# Patient Record
Sex: Female | Born: 1955 | Race: Asian | Hispanic: No | Marital: Married | Smoking: Former smoker
Health system: Southern US, Community
[De-identification: ages and names within clinical notes are randomized; demographics above are authoritative.]

## PROBLEM LIST (undated history)

## (undated) DIAGNOSIS — M1990 Unspecified osteoarthritis, unspecified site: Secondary | ICD-10-CM

## (undated) DIAGNOSIS — I10 Essential (primary) hypertension: Secondary | ICD-10-CM

## (undated) DIAGNOSIS — E119 Type 2 diabetes mellitus without complications: Secondary | ICD-10-CM

## (undated) DIAGNOSIS — R030 Elevated blood-pressure reading, without diagnosis of hypertension: Secondary | ICD-10-CM

## (undated) HISTORY — DX: Unspecified osteoarthritis, unspecified site: M19.90

## (undated) HISTORY — DX: Elevated blood-pressure reading, without diagnosis of hypertension: R03.0

---

## 2000-09-08 ENCOUNTER — Other Ambulatory Visit: Admission: RE | Admit: 2000-09-08 | Discharge: 2000-09-08 | Payer: Self-pay | Admitting: General Practice

## 2002-05-04 ENCOUNTER — Inpatient Hospital Stay (HOSPITAL_COMMUNITY): Admission: AD | Admit: 2002-05-04 | Discharge: 2002-05-04 | Payer: Self-pay | Admitting: *Deleted

## 2002-06-04 ENCOUNTER — Ambulatory Visit (HOSPITAL_COMMUNITY): Admission: RE | Admit: 2002-06-04 | Discharge: 2002-06-04 | Payer: Self-pay | Admitting: *Deleted

## 2002-06-10 ENCOUNTER — Encounter: Admission: RE | Admit: 2002-06-10 | Discharge: 2002-06-10 | Payer: Self-pay | Admitting: *Deleted

## 2002-08-11 ENCOUNTER — Ambulatory Visit (HOSPITAL_COMMUNITY): Admission: RE | Admit: 2002-08-11 | Discharge: 2002-08-11 | Payer: Self-pay | Admitting: *Deleted

## 2002-08-11 ENCOUNTER — Encounter: Admission: RE | Admit: 2002-08-11 | Discharge: 2002-08-11 | Payer: Self-pay | Admitting: *Deleted

## 2002-10-28 ENCOUNTER — Encounter (INDEPENDENT_AMBULATORY_CARE_PROVIDER_SITE_OTHER): Payer: Self-pay

## 2002-10-28 ENCOUNTER — Inpatient Hospital Stay (HOSPITAL_COMMUNITY): Admission: AD | Admit: 2002-10-28 | Discharge: 2002-10-31 | Payer: Self-pay | Admitting: Obstetrics & Gynecology

## 2005-04-18 ENCOUNTER — Emergency Department (HOSPITAL_COMMUNITY): Admission: EM | Admit: 2005-04-18 | Discharge: 2005-04-18 | Payer: Self-pay | Admitting: Emergency Medicine

## 2006-02-18 ENCOUNTER — Emergency Department (HOSPITAL_COMMUNITY): Admission: EM | Admit: 2006-02-18 | Discharge: 2006-02-18 | Payer: Self-pay | Admitting: Emergency Medicine

## 2007-02-14 ENCOUNTER — Emergency Department (HOSPITAL_COMMUNITY): Admission: EM | Admit: 2007-02-14 | Discharge: 2007-02-15 | Payer: Self-pay | Admitting: Emergency Medicine

## 2009-08-06 ENCOUNTER — Emergency Department (HOSPITAL_COMMUNITY)
Admission: EM | Admit: 2009-08-06 | Discharge: 2009-08-06 | Payer: Self-pay | Source: Home / Self Care | Admitting: Emergency Medicine

## 2009-08-29 ENCOUNTER — Emergency Department (HOSPITAL_COMMUNITY): Admission: EM | Admit: 2009-08-29 | Discharge: 2009-08-29 | Payer: Self-pay | Admitting: Emergency Medicine

## 2010-06-29 NOTE — Op Note (Signed)
NAMESAIDI, Dawn [name]                               ACCOUNT NO.:  [id number]   MEDICAL RECORD NO.:  [id number]                   PATIENT TYPE:  INP   LOCATION:  9124                                 FACILITY:  [facility]   PHYSICIAN:  Lesly Dukes, M.D.              DATE OF BIRTH:  December 04, 1955   DATE OF PROCEDURE:  10/28/2002  DATE OF DISCHARGE:                                 OPERATIVE REPORT   PREOPERATIVE DIAGNOSES:  1. Frank breech in labor.  2. Multiparity, with desire for permanent sterilization.   POSTOPERATIVE DIAGNOSES:  1. Frank breech in labor.  2. Multiparity, with desire for permanent sterilization.   FINDINGS:  A viable female infant, Apgars 8 at one, 9 at five, found in  frank breech presentation __________ , with normal tubes and ovaries  bilaterally.   PROCEDURE:  After informed consent was obtained, the patient was taken to  the operating room, where spinal anesthesia was found to be adequate.  The  patient was placed in the dorsal supine position with a left lateral tilt.  The abdomen was prepped and draped in a sterile fashion.  An IV was running.  A Pfannenstiel skin incision was made with a scalpel and carried down  through skin and subcutaneous tissue to the rectus fascia.  The fascia was  incised in the midline and the incision extended bilaterally with Mayo  scissors.  The superior and inferior aspect of the fascia were grasped  __________ layer separated in the midline.  The peritoneum was divided  sharply in a transverse fashion __________  of the bladder.  The bladder  blade was inserted.  The vesicouterine peritoneum was identified and tented  up and entered sharply with Metzenbaum scissors.  The incision was extended  bilaterally and the bladder flap was elevated distally.  The bladder blade  was reinserted.  The uterine incision was made with a scalpel and extended  bilaterally with bandage scissors.  Baby delivered by frank breech without  incident.  The  nose and mouth were suctioned with DeLee and bulb suction.  The cord was clamped and cut, and baby handed off to the awaiting  pediatrician.  The placenta delivered manually with a three-vessel cord.  The uterus was exteriorized and freed of all clots and debris.  The incision  was closed with 0 Vicryl in a running locked fashion.  Good hemostasis was  noted.  The tubal was then completed.  A [name] was used to elevate the  midportion of the tube, which was then ligated with 2-0 plain with 2  stitches.  The portion of the tube was then cut and sent to pathology.  This  was done on both the right and left side.  Both sides were noted to be  hemostatic.  The gutters were cleared of all clot and debris, and the uterus  was returned to the abdomen.  Good hemostasis was noted on the uterine  incision.  The fascia was closed with 0 Vicryl and good hemostasis was  noted.  The subcutaneous tissue was irrigated.  The patient tolerated the  procedure well. Sponge, lap, instrument, and needle counts correct x2, and  patient went to the recovery room in stable condition.                                               Lesly Dukes, M.D.    [name]  D:  10/28/2002  T:  10/29/2002  Job:  [id number]

## 2010-06-29 NOTE — Discharge Summary (Signed)
[name], [name]                               ACCOUNT NO.:  [id number]   MEDICAL RECORD NO.:  [id number]                   PATIENT TYPE:  INP   LOCATION:  9124                                 FACILITY:  [facility]   PHYSICIAN:  Lesly Dukes, M.D.              DATE OF BIRTH:  1955/05/28   DATE OF ADMISSION:  10/28/2002  DATE OF DISCHARGE:  10/31/2002                                 DISCHARGE SUMMARY   ADMISSION DIAGNOSES:  Multipara at term in active labor with frank breech  presentation.   DISCHARGE DIAGNOSES:  Primary low transverse cesarean section and bilateral  tubal ligation.  Viable female infant Apgars 8/1, 9/5.  Weight 6 pounds 8  ounces.  Thick meconium.   HISTORY:  A 55 year old G3, P2-0-0-2 at 37-5/7 weeks via sure LMP consistent  with 16-week scan presented with history of contractions for about two hours  and rupture of membranes in the maternity admissions area, clear fluid.  She  denied vaginal bleeding and reported that her fetal activity was decreased.  Her pregnancy care was at [facility] with onset at 16 weeks, significant  for advanced maternal age, tobacco use, and yeast infection.   MEDICATIONS:  PND.   ALLERGIES:  None.   PAST OBSTETRICAL HISTORY:  She had term SVDs x2.   GYN HISTORY:  Negative for STIs or abnormal Pap smears.   PAST MEDICAL HISTORY:  Noncontributory.   PAST SURGICAL HISTORY:  None.   SOCIAL HISTORY:  Positive tobacco use.  No illicit drug use.   FAMILY HISTORY:  Noncontributory.   PRENATAL LABORATORIES:  Immunity to rubella and B+ blood type.  Her one-hour  glucose test was 106.   PHYSICAL EXAMINATION:  GENERAL:  Admission physical examination was within  normal limits.  PELVIC:  Was noted on visual cervical examination that she was frank breech  and 7-8 cm dilated.  Fetal heart rate was in the 140s, reactive with good  variability and no decelerations.  Her contraction frequency was every 1-3  minutes.   HOSPITAL COURSE:   The patient was taken from maternity admissions to the  operating room for an urgent low transverse cesarean section under spinal  anesthesia.  Findings were as above.  Her postoperative course was  unremarkable.  She was breast-feeding.  Her pain was well controlled.  Her  discharge hemoglobin 11.8, hematocrit 33.1, platelets borderline low at  120,000.  On admission they were 156,000.  She remained afebrile with some  high normal blood pressures.  On postoperative day three she was ambulatory  with orthostatic symptoms.  Pain was tolerable.  Her lochia was tapering.  She was breast-feeding well.  Her blood pressure was 120/80.  She was  afebrile.  Breasts were soft and filling.  Abdomen was soft, nontender,  nondistended.  Incision was clean, dry, intact, and appropriately tender.  Lungs were clear.  Heart regular  rate and rhythm.  Extremities were  nontender.  Staples were removed and she was discharged home to have follow-  up at [facility] at 6 weeks.  As noted above, she had a bilateral tubal  ligation due to multiparity and desires of no further fertility.   DISCHARGE MEDICATIONS:  1. Prenatal vitamins one daily.  2. Ibuprofen 600 mg one q.6h. p.r.n.  3. Percocet 5/325 one q.4-6h. p.r.n. for pain.   CONDITION ON DISCHARGE:  She was discharged home in good condition.     Deirdre Christy Gentles, C.N.M.                       Lesly Dukes, M.D.    DP/MEDQ  D:  12/13/2002  T:  12/14/2002  Job:  [id number]

## 2010-11-01 LAB — URINALYSIS, ROUTINE W REFLEX MICROSCOPIC
Glucose, UA: NEGATIVE
Hgb urine dipstick: NEGATIVE
Ketones, ur: NEGATIVE
Protein, ur: NEGATIVE
Urobilinogen, UA: 1
pH: 6.5

## 2010-11-01 LAB — COMPREHENSIVE METABOLIC PANEL
ALT: 9
AST: 18
CO2: 28
Calcium: 9
Chloride: 105
Glucose, Bld: 105 — ABNORMAL HIGH
Potassium: 3.3 — ABNORMAL LOW
Sodium: 139
Total Bilirubin: 0.6

## 2010-11-01 LAB — CBC
Hemoglobin: 13.4
MCHC: 34.4
MCV: 81.3
Platelets: 196
RBC: 4.79
WBC: 8.9

## 2010-11-01 LAB — DIFFERENTIAL
Eosinophils Relative: 1
Lymphs Abs: 3.2
Monocytes Absolute: 0.4
Monocytes Relative: 5

## 2013-04-17 ENCOUNTER — Emergency Department (INDEPENDENT_AMBULATORY_CARE_PROVIDER_SITE_OTHER)
Admission: EM | Admit: 2013-04-17 | Discharge: 2013-04-17 | Disposition: A | Discharge status: Home / Self Care | Payer: Self-pay | Source: Home / Self Care | Attending: Family Medicine | Admitting: Family Medicine

## 2013-04-17 ENCOUNTER — Encounter (HOSPITAL_COMMUNITY): Payer: Self-pay | Admitting: Emergency Medicine

## 2013-04-17 ENCOUNTER — Emergency Department (INDEPENDENT_AMBULATORY_CARE_PROVIDER_SITE_OTHER): Payer: Self-pay

## 2013-04-17 DIAGNOSIS — S63617A Unspecified sprain of left little finger, initial encounter: Secondary | ICD-10-CM

## 2013-04-17 DIAGNOSIS — S6390XA Sprain of unspecified part of unspecified wrist and hand, initial encounter: Secondary | ICD-10-CM

## 2013-04-17 NOTE — ED Provider Notes (Signed)
CSN: [id number]     Arrival date & time 04/17/13  0932 History   First MD Initiated Contact with Patient 04/17/13 1009     Chief Complaint  Patient presents with  . Fall   (Consider location/radiation/quality/duration/timing/severity/associated sxs/prior Treatment) Patient is a 58 y.o. female presenting with hand injury. The history is provided by the patient.  Hand Injury Location:  Hand Time since incident:  1 week Injury: yes   Mechanism of injury comment:  Slipped and fell in snow  Hand location:  L hand Pain details:    Radiates to:  Does not radiate   Severity:  Mild   Onset quality:  Sudden   Progression:  Worsening Chronicity:  New Dislocation: no   Associated symptoms: back pain, neck pain and swelling     History reviewed. No pertinent past medical history. History reviewed. No pertinent past surgical history. History reviewed. No pertinent family history. History  Substance Use Topics  . Smoking status: Current Every Day Smoker  . Smokeless tobacco: Not on file  . Alcohol Use: No   OB History   Grav Para Term Preterm Abortions TAB SAB Ect Mult Living                 Review of Systems  Constitutional: Negative.   Cardiovascular: Negative for chest pain.  Gastrointestinal: Negative for abdominal pain.  Musculoskeletal: Positive for back pain, joint swelling and neck pain.  Neurological: Negative for headaches.    Allergies  Review of patient's allergies indicates no known allergies.  Home Medications  No current outpatient prescriptions on file. BP 123/76  Pulse 68  Temp(Src) 98.2 F (36.8 C) (Oral)  Resp 18  SpO2 97% Physical Exam  Nursing note and vitals reviewed. Constitutional: She is oriented to person, place, and time. She appears well-developed and well-nourished.  HENT:  Head: Normocephalic and atraumatic.  Eyes: EOM are normal. Pupils are equal, round, and reactive to light.  Neck: Normal range of motion. Neck supple.  Musculoskeletal:  She exhibits tenderness.       Left hand: She exhibits decreased range of motion, tenderness and bony tenderness. Normal sensation noted. Normal strength noted.       Hands: Neurological: She is alert and oriented to person, place, and time.  Skin: Skin is warm and dry.    ED Course  Procedures (including critical care time) Labs Review Labs Reviewed - No data to display Imaging Review Dg Hand Complete Left  04/17/2013   CLINICAL DATA:  Fall.  EXAM: LEFT HAND - COMPLETE 3+ VIEW  COMPARISON:  None.  FINDINGS: There is no evidence of fracture or dislocation. There is no evidence of arthropathy or other focal bone abnormality. Soft tissues are unremarkable.  IMPRESSION: Negative.   Electronically Signed   By: Kerby Moors M.D.   On: 04/17/2013 10:52   X-rays reviewed and report per radiologist.   MDM   1. Sprain of hand, fifth finger, left        Billy Fischer, MD 04/17/13 1103
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
## 2013-04-17 NOTE — Discharge Instructions (Signed)
Soak hand in warm water every day for 15 minutes. Wear splint for comfort as needed.ibuprofen for pain.

## 2016-07-17 ENCOUNTER — Ambulatory Visit (HOSPITAL_COMMUNITY)
Admission: EM | Admit: 2016-07-17 | Discharge: 2016-07-17 | Disposition: A | Discharge status: Nursing Home (Includes ICF) | Payer: BLUE CROSS/BLUE SHIELD | Attending: Internal Medicine | Admitting: Internal Medicine

## 2016-07-17 ENCOUNTER — Emergency Department (HOSPITAL_COMMUNITY)
Admission: EM | Admit: 2016-07-17 | Discharge: 2016-07-17 | Disposition: A | Discharge status: Home / Self Care | Payer: BLUE CROSS/BLUE SHIELD | Attending: Emergency Medicine | Admitting: Emergency Medicine

## 2016-07-17 ENCOUNTER — Encounter (HOSPITAL_COMMUNITY): Payer: Self-pay | Admitting: Emergency Medicine

## 2016-07-17 ENCOUNTER — Ambulatory Visit (INDEPENDENT_AMBULATORY_CARE_PROVIDER_SITE_OTHER): Payer: BLUE CROSS/BLUE SHIELD

## 2016-07-17 DIAGNOSIS — I509 Heart failure, unspecified: Secondary | ICD-10-CM

## 2016-07-17 DIAGNOSIS — R0600 Dyspnea, unspecified: Secondary | ICD-10-CM

## 2016-07-17 DIAGNOSIS — F17200 Nicotine dependence, unspecified, uncomplicated: Secondary | ICD-10-CM | POA: Insufficient documentation

## 2016-07-17 DIAGNOSIS — R0789 Other chest pain: Secondary | ICD-10-CM | POA: Insufficient documentation

## 2016-07-17 LAB — BASIC METABOLIC PANEL
Anion gap: 8 (ref 5–15)
BUN: 16 mg/dL (ref 6–20)
CALCIUM: 8.9 mg/dL (ref 8.9–10.3)
CO2: 24 mmol/L (ref 22–32)
CREATININE: 0.89 mg/dL (ref 0.44–1.00)
Chloride: 107 mmol/L (ref 101–111)
GFR calc non Af Amer: >60 mL/min (ref >60)
Glucose, Bld: 108 mg/dL — ABNORMAL HIGH (ref 65–99)
Potassium: 4 mmol/L (ref 3.5–5.1)
SODIUM: 139 mmol/L (ref 135–145)

## 2016-07-17 LAB — CBC
HCT: 39.9 % (ref 36.0–46.0)
Hemoglobin: 13.2 g/dL (ref 12.0–15.0)
MCH: 27.2 pg (ref 26.0–34.0)
MCHC: 33.1 g/dL (ref 30.0–36.0)
MCV: 82.3 fL (ref 78.0–100.0)
PLATELETS: 242 10*3/uL (ref 150–400)
RBC: 4.85 MIL/uL (ref 3.87–5.11)
RDW: 13.8 % (ref 11.5–15.5)
WBC: 10.8 10*3/uL — AB (ref 4.0–10.5)

## 2016-07-17 LAB — BRAIN NATRIURETIC PEPTIDE: B NATRIURETIC PEPTIDE 5: 42.5 pg/mL (ref 0.0–100.0)

## 2016-07-17 LAB — I-STAT TROPONIN, ED
TROPONIN I, POC: 0 ng/mL (ref 0.00–0.08)
TROPONIN I, POC: 0 ng/mL (ref 0.00–0.08)

## 2016-07-17 MED ORDER — FUROSEMIDE 10 MG/ML IJ SOLN
20.0000 mg | Freq: Once | INTRAMUSCULAR | Status: AC
  Administered 2016-07-17: 20 mg via INTRAVENOUS
  Filled 2016-07-17: qty 2

## 2016-07-17 MED ORDER — FUROSEMIDE 20 MG PO TABS: 20.0000 mg | ORAL_TABLET | Freq: Every day | ORAL | 0 refills | Status: DC

## 2016-07-17 NOTE — ED Triage Notes (Signed)
[facility] and [facility], they told her she had heart failure. C/o sob x2 years. Coughing. Pt in NAD.

## 2016-07-17 NOTE — Discharge Instructions (Signed)
Your Chest x-ray suggests findings of heart failure. Please take water pill to help with your symptoms.  You will need to see a heart doctor as soon as possible for work-up.  Please call the cardiology office tomorrow to set up close follow-up appointment. Please return without fail for worsening symptoms, including shortness of breath, passing out, worsening pain or any other symptoms concerning to you.

## 2016-07-17 NOTE — ED Provider Notes (Signed)
CSN: [id number]     Arrival date & time 07/17/16  1032 History   First MD Initiated Contact with Patient 07/17/16 1246     Chief Complaint  Patient presents with  . URI   (Consider location/radiation/quality/duration/timing/severity/associated sxs/prior Treatment) 61 year old female presents with shortness of breath and random body aches for the past 2 years. However yesterday her breathing got worse and she is having more right sided chest pain. Also experiencing a mild dry cough. Denies any fever, ear pain or other URI symptoms. No history of asthma or allergies. Does smoke tobacco daily. No history of cardiac issues. No other chronic health issues. Takes no daily medication.    The history is provided by the patient and a relative. The history is limited by a language barrier.    History reviewed. No pertinent past medical history. History reviewed. No pertinent surgical history. No family history on file. Social History  Substance Use Topics  . Smoking status: Current Every Day Smoker  . Smokeless tobacco: Not on file  . Alcohol use No   OB History    No data available     Review of Systems  Constitutional: Positive for fatigue. Negative for activity change, appetite change, chills and fever.  HENT: Negative for congestion, ear pain, facial swelling, postnasal drip, rhinorrhea, sinus pain, sinus pressure, sore throat and trouble swallowing.   Eyes: Negative for photophobia and visual disturbance.  Respiratory: Positive for cough and shortness of breath. Negative for chest tightness and wheezing.   Cardiovascular: Positive for chest pain (right sided). Negative for palpitations.  Gastrointestinal: Negative for diarrhea, nausea and vomiting.  Genitourinary: Negative for difficulty urinating.  Musculoskeletal: Positive for arthralgias and myalgias. Negative for neck pain and neck stiffness.  Skin: Negative for rash and wound.  Neurological: Positive for numbness (random  extremity). Negative for dizziness, syncope, facial asymmetry, weakness, light-headedness and headaches.  Hematological: Negative for adenopathy.    Allergies  Patient has no known allergies.  Home Medications   Prior to Admission medications   Medication Sig Start Date End Date Taking? Authorizing Provider  Aspirin-Salicylamide-Caffeine (BC HEADACHE POWDER PO) Take by mouth.   Yes [provider]   Meds Ordered and Administered this Visit  Medications - No data to display  BP 134/73 (BP Location: Left Arm)   Pulse 69   Temp 98.7 F (37.1 C) (Oral)   Resp 18   SpO2 97%  No data found.   Physical Exam  Constitutional: She appears well-developed and well-nourished. No distress.  Patient resting comfortably on exam table. In no acute distress.   HENT:  Head: Normocephalic and atraumatic.  Right Ear: Hearing and external ear normal.  Left Ear: Hearing and external ear normal.  Nose: Nose normal.  Mouth/Throat: Uvula is midline, oropharynx is clear and moist and mucous membranes are normal.  Neck: Normal range of motion. Neck supple.  Cardiovascular: Normal rate, regular rhythm and normal heart sounds.   Pulmonary/Chest: Effort normal and breath sounds normal. No respiratory distress. She has no decreased breath sounds. She has no wheezes. She has no rhonchi. She has no rales. She exhibits tenderness.    Tender along right upper chest area above breast. No redness, swelling or bruising present.   Musculoskeletal: Normal range of motion.  Lymphadenopathy:    She has no cervical adenopathy.  Neurological: She is alert.  Skin: Skin is warm and dry.  Psychiatric: She has a normal mood and affect.    Urgent Care Course  Procedures (including critical care time)  Labs Review Labs Reviewed - No data to display  Imaging Review Dg Chest 2 View  Result Date: 07/17/2016 CLINICAL DATA:  Chest pain. EXAM: CHEST  2 VIEW COMPARISON:  08/29/2009. FINDINGS: Mediastinum  hilar structures normal. Cardiomegaly with mild bilateral interstitial prominence. Findings consistent with mild CHF. Other etiologies of interstitial prominence including pneumonitis cannot be excluded . No pleural effusion or pneumothorax. IMPRESSION: Congestive heart failure mild interstitial edema. Other etiologies of interstitial lung disease including pneumonitis cannot be excluded. Findings are new from 08/29/2009. Electronically Signed   By: Marcello Moores  Register   On: 07/17/2016 13:26     Visual Acuity Review  Right Eye Distance:   Left Eye Distance:   Bilateral Distance:    Right Eye Near:   Left Eye Near:    Bilateral Near:         MDM   1. Other congestive heart failure ([place])   2. Dyspnea, unspecified type    Reviewed chest x-ray results with patient and relative. Due to findings of mild CHF and having acute symptoms, recommend patient go to ER for further evaluation and treatment. Patient vital signs are stable and she is in no acute distress so she is sent by shuttle to the ER. Patient and Relative understands and will go now for further evaluation.      Katy Apo, NP 07/17/16 1416

## 2016-07-17 NOTE — ED Triage Notes (Addendum)
Complains of body aches and difficulty breathing for 2 years, patient is a smoker.  Denies cold symptoms.  Patient has had more difficulty breathing since yesterday.  Patient has a dry cough.  There is no new pain.  All pain has been present for 2 years

## 2016-07-17 NOTE — ED Notes (Signed)
Pt ambulated with stand by assist without difficulty; normal gait noted. Pt denied any dizziness, weakness, lightheadedness during such.   Lowest O2 sat: 94% Highest O2 sat: 97%  HR range 84-96bpm

## 2016-07-17 NOTE — Discharge Instructions (Signed)
Due to the findings on the chest x-ray which indicated heart failure, you need to go to the ER now for further evaluation.

## 2016-07-17 NOTE — ED Provider Notes (Signed)
[facility] DEPT Provider Note   CSN: [id number] Arrival date & time: 07/17/16  1533     History   Chief Complaint Chief Complaint  Patient presents with  . Chest Pain    HPI Dawn [name] is a 61 y.o. female.  HPI  61 year old female who presents with chest pain. She has no significant PMH. Reports 3 days of constant right anterior chest wall pain that is worse with movement and palpation. 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 Did not take any medications for symptoms. No alleviating factors. Over past day has felt short of breath with activity. No LE edema, orthopnea, PND, fever or chills, pleuritic chest pain, nausea, vomiting, diaphoresis. Has had mild cough, nonproductive of sputum. No known sick contacts. No known family history or cardiac or pulmonary medical problems.   Seen at [facility] today with CXR concerning for CHF and she was sent to ED for evaluation.  History reviewed. No pertinent past medical history.  There are no active problems to display for this patient.   History reviewed. No pertinent surgical history.  OB History    No data available       Home Medications    Prior to Admission medications   Medication Sig Start Date End Date Taking? Authorizing Provider  Aspirin-Salicylamide-Caffeine (BC HEADACHE POWDER PO) Take by mouth.    [provider]  furosemide (LASIX) 20 MG tablet Take 1 tablet (20 mg total) by mouth daily. 07/17/16   Forde Dandy, MD    Family History No family history on file.  Social History Social History  Substance Use Topics  . Smoking status: Current Every Day Smoker  . Smokeless tobacco: Not on file  . Alcohol use No     Allergies   Patient has no known allergies.   Review of Systems Review of Systems  Constitutional: Negative for fever.  HENT: Negative for congestion.   Respiratory: Positive for cough and shortness of breath.   Cardiovascular: Positive for  chest pain. Negative for leg swelling.  Gastrointestinal: Negative for abdominal pain.  Musculoskeletal: Negative for back pain.  Allergic/Immunologic: Negative for immunocompromised state.  Neurological: Negative for syncope and light-headedness.  Hematological: Does not bruise/bleed easily.  Psychiatric/Behavioral: Negative for confusion.  All other systems reviewed and are negative.    Physical Exam Updated Vital Signs BP (!) 115/50   Pulse 67   Temp 98.1 F (36.7 C) (Oral)   Resp (!) 22   SpO2 95%   Physical Exam Physical Exam  Nursing note and vitals reviewed. Constitutional: Well developed, well nourished, non-toxic, and in no acute distress Head: Normocephalic and atraumatic.  Mouth/Throat: Oropharynx is clear and moist.  Neck: Normal range of motion. Neck supple.  Cardiovascular: Normal rate and regular rhythm.  right anterior chest wall pain with palpation Pulmonary/Chest: Effort normal and breath sounds normal. no conversational dyspnea.  Abdominal: Soft. There is no tenderness. There is no rebound and no guarding.  Musculoskeletal: Normal range of motion. no edema. Neurological: Alert, no facial droop, fluent speech, moves all extremities symmetrically Skin: Skin is warm and dry.  Psychiatric: Cooperative   ED Treatments / Results  Labs (all labs ordered are listed, but only abnormal results are displayed) Labs Reviewed  BASIC METABOLIC PANEL - Abnormal; Notable for the following:       Result Value   Glucose, Bld 108 (*)    All other components within normal limits  CBC - Abnormal;  Notable for the following:    WBC 10.8 (*)    All other components within normal limits  BRAIN NATRIURETIC PEPTIDE  I-STAT TROPOININ, ED  I-STAT TROPOININ, ED    EKG  EKG Interpretation None       Radiology Dg Chest 2 View  Result Date: 07/17/2016 CLINICAL DATA:  Chest pain. EXAM: CHEST  2 VIEW COMPARISON:  08/29/2009. FINDINGS: Mediastinum hilar structures normal.  Cardiomegaly with mild bilateral interstitial prominence. Findings consistent with mild CHF. Other etiologies of interstitial prominence including pneumonitis cannot be excluded . No pleural effusion or pneumothorax. IMPRESSION: Congestive heart failure mild interstitial edema. Other etiologies of interstitial lung disease including pneumonitis cannot be excluded. Findings are new from 08/29/2009. Electronically Signed   By: Marcello Moores  Register   On: 07/17/2016 13:26    Procedures Procedures (including critical care time)  Medications Ordered in ED Medications  furosemide (LASIX) injection 20 mg (20 mg Intravenous Given 07/17/16 1904)     Initial Impression / Assessment and Plan / ED Course  I have reviewed the triage vital signs and the nursing notes.  Pertinent labs & imaging results that were available during my care of the patient were reviewed by me and considered in my medical decision making (see chart for details).     Presenting with CXR concerning for CHF. She does not look clinically fluid overloaded. Ambulates in ED with normal work of breathing and normal pulse ox. Chest pain is reproduced with movement and palpation, and seems c/w MSK pain and she has reasonable history for it. EKG is without heart strain or ischemia. Troponin is normal. BNP is normal. She is given 20 mg of IV lasix here. I do not feel it is unreasonable for her to obtain CHF work-up as outpatient given she is well appearing, not short of breath or hypoxic in ED. Started on 20 mg lasix orally for home and given cardiology follow-up. Discussed strict return instructions that would warrant admission. Strict return and follow-up instructions reviewed. She expressed understanding of all discharge instructions and felt comfortable with the plan of care.   Final Clinical Impressions(s) / ED Diagnoses   Final diagnoses:  Chest wall pain  Acute congestive heart failure, unspecified heart failure type [facility])    New  Prescriptions Discharge Medication List as of 07/17/2016 10:33 PM    START taking these medications   Details  furosemide (LASIX) 20 MG tablet Take 1 tablet (20 mg total) by mouth daily., Starting Wed 07/17/2016, Print         Forde Dandy, MD 07/18/16 [phone]

## 2016-07-22 DIAGNOSIS — R079 Chest pain, unspecified: Secondary | ICD-10-CM | POA: Insufficient documentation

## 2016-07-22 DIAGNOSIS — I509 Heart failure, unspecified: Secondary | ICD-10-CM | POA: Insufficient documentation

## 2016-08-02 ENCOUNTER — Ambulatory Visit: Payer: BLUE CROSS/BLUE SHIELD | Admitting: Cardiovascular Disease
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
## 2016-08-27 ENCOUNTER — Ambulatory Visit (INDEPENDENT_AMBULATORY_CARE_PROVIDER_SITE_OTHER): Payer: BLUE CROSS/BLUE SHIELD | Admitting: Cardiology

## 2016-08-27 ENCOUNTER — Encounter: Payer: Self-pay | Admitting: Cardiology

## 2016-08-27 VITALS — BP 152/76 | HR 68 | Ht 62.0 in | Wt 142.0 lb

## 2016-08-27 DIAGNOSIS — Z720 Tobacco use: Secondary | ICD-10-CM

## 2016-08-27 DIAGNOSIS — R030 Elevated blood-pressure reading, without diagnosis of hypertension: Secondary | ICD-10-CM | POA: Diagnosis not present

## 2016-08-27 DIAGNOSIS — I5031 Acute diastolic (congestive) heart failure: Secondary | ICD-10-CM | POA: Diagnosis not present

## 2016-08-27 DIAGNOSIS — R0789 Other chest pain: Secondary | ICD-10-CM | POA: Diagnosis not present

## 2016-08-27 NOTE — Patient Instructions (Signed)
Medication Instructions:   NO CHANGE  Testing/Procedures:  Your physician has requested that you have an echocardiogram. Echocardiography is a painless test that uses sound waves to create images of your heart. It provides your doctor with information about the size and shape of your heart and how well your heart's chambers and valves are working. This procedure takes approximately one hour. There are no restrictions for this procedure.    Follow-Up:  Your physician recommends that you schedule a follow-up appointment in: AS NEEDED      

## 2016-09-05 ENCOUNTER — Other Ambulatory Visit (HOSPITAL_COMMUNITY): Payer: BLUE CROSS/BLUE SHIELD

## 2016-09-09 ENCOUNTER — Other Ambulatory Visit (HOSPITAL_COMMUNITY): Payer: BLUE CROSS/BLUE SHIELD

## 2016-09-20 ENCOUNTER — Ambulatory Visit (HOSPITAL_COMMUNITY): Payer: BLUE CROSS/BLUE SHIELD | Attending: Cardiovascular Disease

## 2016-09-20 ENCOUNTER — Other Ambulatory Visit: Payer: Self-pay

## 2016-09-20 DIAGNOSIS — I5031 Acute diastolic (congestive) heart failure: Secondary | ICD-10-CM | POA: Diagnosis not present

## 2016-09-20 DIAGNOSIS — R0789 Other chest pain: Secondary | ICD-10-CM | POA: Diagnosis not present

## 2016-09-24 ENCOUNTER — Encounter: Payer: Self-pay | Admitting: *Deleted

## 2018-04-28 ENCOUNTER — Other Ambulatory Visit: Payer: Self-pay

## 2018-04-28 ENCOUNTER — Ambulatory Visit (HOSPITAL_COMMUNITY)
Admission: EM | Admit: 2018-04-28 | Discharge: 2018-04-28 | Disposition: A | Discharge status: Home / Self Care | Payer: BLUE CROSS/BLUE SHIELD | Attending: Physician Assistant | Admitting: Physician Assistant

## 2018-04-28 ENCOUNTER — Encounter (HOSPITAL_COMMUNITY): Payer: Self-pay | Admitting: Emergency Medicine

## 2018-04-28 DIAGNOSIS — G44209 Tension-type headache, unspecified, not intractable: Secondary | ICD-10-CM

## 2018-04-28 MED ORDER — IBUPROFEN 600 MG PO TABS: 600.0000 mg | ORAL_TABLET | Freq: Four times a day (QID) | ORAL | 0 refills | Status: DC | PRN

## 2018-04-28 MED ORDER — KETOROLAC TROMETHAMINE 60 MG/2ML IM SOLN
INTRAMUSCULAR | Status: AC
  Filled 2018-04-28: qty 2

## 2018-04-28 MED ORDER — KETOROLAC TROMETHAMINE 60 MG/2ML IM SOLN
60.0000 mg | Freq: Once | INTRAMUSCULAR | Status: AC
  Administered 2018-04-28: 60 mg via INTRAMUSCULAR

## 2018-04-28 NOTE — Discharge Instructions (Addendum)
See your Physician for recheck.  Stop BC powders

## 2018-04-28 NOTE — ED Provider Notes (Signed)
[facility]    CSN: [id number] Arrival date & time: 04/28/18  [place]     History   Chief Complaint Chief Complaint  Patient presents with  . Headache    HPI Dawn [name] is a 63 y.o. female.   The history is provided by the patient. No language interpreter was used.  Headache  Pain location:  Generalized Quality:  Unable to specify Radiates to:  Does not radiate Severity currently:  5/10 Onset quality:  Gradual Timing:  Constant Progression:  Worsening Chronicity:  New Relieved by:  Nothing Worsened by:  Nothing Ineffective treatments:  None tried Associated symptoms: ear pain   Risk factors: no anger and does not have insomnia   Pt reports she has a buzzing in her ears and a headache.  Pt reports she has been taking BC powders   Past Medical History:  Diagnosis Date  . Elevated BP without diagnosis of hypertension     Patient Active Problem List   Diagnosis Date Noted  . CHF (congestive heart failure) ([place]) 07/22/2016  . Chest pain 07/22/2016    Past Surgical History:  Procedure Laterality Date  . CESAREAN SECTION      OB History   No obstetric history on file.      Home Medications    Prior to Admission medications   Medication Sig Start Date End Date Taking? Authorizing Provider  Aspirin-Salicylamide-Caffeine (BC HEADACHE POWDER PO) Take by mouth.    [provider]  furosemide (LASIX) 20 MG tablet Take 1 tablet (20 mg total) by mouth daily. 07/17/16   Forde Dandy, MD  ibuprofen (ADVIL,MOTRIN) 600 MG tablet Take 1 tablet (600 mg total) by mouth every 6 (six) hours as needed. 04/28/18   Fransico Meadow, PA-C    Family History Family History  Problem Relation Age of Onset  . Chorea Father     Social History Social History   Tobacco Use  . Smoking status: Current Every Day Smoker  . Smokeless tobacco: Never Used  Substance Use Topics  . Alcohol use: Yes  . Drug use: Not on file     Allergies   Patient has no known  allergies.   Review of Systems Review of Systems  HENT: Positive for ear pain.   Neurological: Positive for headaches.  All other systems reviewed and are negative.    Physical Exam Triage Vital Signs ED Triage Vitals  Enc Vitals Group     BP 04/28/18 1800 (!) 168/70     Pulse Rate 04/28/18 1800 67     Resp 04/28/18 1800 18     Temp 04/28/18 1800 98.5 F (36.9 C)     Temp Source 04/28/18 1800 Oral     SpO2 04/28/18 1800 97 %     Weight --      Height --      Head Circumference --      Peak Flow --      Pain Score 04/28/18 1758 4     Pain Loc --      Pain Edu? --      Excl. in [place]? --    No data found.  Updated Vital Signs BP (!) 168/70 (BP Location: Left Arm)   Pulse 67   Temp 98.5 F (36.9 C) (Oral)   Resp 18   SpO2 97%   Visual Acuity Right Eye Distance:   Left Eye Distance:   Bilateral Distance:    Right Eye Near:   Left Eye Near:  Bilateral Near:     Physical Exam Vitals signs and nursing note reviewed.  Constitutional:      Appearance: She is well-developed.  HENT:     Head: Normocephalic.     Mouth/Throat:     Mouth: Mucous membranes are moist.  Eyes:     Extraocular Movements: Extraocular movements intact.  Neck:     Musculoskeletal: Normal range of motion.  Cardiovascular:     Rate and Rhythm: Normal rate.     Heart sounds: Normal heart sounds.  Pulmonary:     Effort: Pulmonary effort is normal.  Abdominal:     General: There is no distension.  Musculoskeletal: Normal range of motion.  Skin:    General: Skin is warm.  Neurological:     Mental Status: She is alert and oriented to person, place, and time.  Psychiatric:        Mood and Affect: Mood normal.      UC Treatments / Results  Labs (all labs ordered are listed, but only abnormal results are displayed) Labs Reviewed - No data to display  EKG None  Radiology No results found.  Procedures Procedures (including critical care time)  Medications Ordered in UC  Medications  ketorolac (TORADOL) injection 60 mg (60 mg Intramuscular Given 04/28/18 1905)    Initial Impression / Assessment and Plan / UC Course  I have reviewed the triage vital signs and the nursing notes.  Pertinent labs & imaging results that were available during my care of the patient were reviewed by me and considered in my medical decision making (see chart for details).     Pt given torodol IM.   Pt feels better.   I suspect ear buzzing may be second to asa in goody powder.  Pt has normal neuro exam  Pt advised to go to Ed if symptoms worsen or change.  Final Clinical Impressions(s) / UC Diagnoses   Final diagnoses:  Tension-type headache, not intractable, unspecified chronicity pattern     Discharge Instructions     See your Physician for recheck.  Stop BC powders    ED Prescriptions    Medication Sig Dispense Auth. Provider   ibuprofen (ADVIL,MOTRIN) 600 MG tablet Take 1 tablet (600 mg total) by mouth every 6 (six) hours as needed. 20 tablet Fransico Meadow, [place]     Controlled Substance Prescriptions [place] Controlled Substance Registry consulted? Not Applicable  An After Visit Summary was printed and given to the patient.   Fransico Meadow, [place] 04/28/18 2026

## 2018-04-28 NOTE — ED Triage Notes (Addendum)
Headache started yesterday.  Complains of legs pain, denies cough and cold symptoms.  Patient gets dizzy

## 2019-05-19 ENCOUNTER — Encounter (HOSPITAL_COMMUNITY): Payer: Self-pay | Admitting: Pediatrics

## 2019-05-19 ENCOUNTER — Emergency Department (HOSPITAL_COMMUNITY)
Admission: EM | Admit: 2019-05-19 | Discharge: 2019-05-20 | Disposition: A | Discharge status: Home / Self Care | Payer: Self-pay | Attending: Emergency Medicine | Admitting: Emergency Medicine

## 2019-05-19 ENCOUNTER — Emergency Department (HOSPITAL_COMMUNITY): Payer: Self-pay

## 2019-05-19 ENCOUNTER — Other Ambulatory Visit: Payer: Self-pay

## 2019-05-19 DIAGNOSIS — I509 Heart failure, unspecified: Secondary | ICD-10-CM | POA: Insufficient documentation

## 2019-05-19 DIAGNOSIS — F17210 Nicotine dependence, cigarettes, uncomplicated: Secondary | ICD-10-CM | POA: Insufficient documentation

## 2019-05-19 DIAGNOSIS — M791 Myalgia, unspecified site: Secondary | ICD-10-CM

## 2019-05-19 DIAGNOSIS — M7918 Myalgia, other site: Secondary | ICD-10-CM | POA: Insufficient documentation

## 2019-05-19 DIAGNOSIS — I110 Hypertensive heart disease with heart failure: Secondary | ICD-10-CM | POA: Insufficient documentation

## 2019-05-19 DIAGNOSIS — I159 Secondary hypertension, unspecified: Secondary | ICD-10-CM

## 2019-05-19 DIAGNOSIS — E876 Hypokalemia: Secondary | ICD-10-CM | POA: Insufficient documentation

## 2019-05-19 HISTORY — DX: Essential (primary) hypertension: I10

## 2019-05-19 LAB — COMPREHENSIVE METABOLIC PANEL
ALT: 10 U/L (ref 0–44)
AST: 22 U/L (ref 15–41)
Albumin: 3.9 g/dL (ref 3.5–5.0)
Alkaline Phosphatase: 81 U/L (ref 38–126)
Anion gap: 12 (ref 5–15)
BUN: 7 mg/dL — ABNORMAL LOW (ref 8–23)
CO2: 25 mmol/L (ref 22–32)
Calcium: 9.3 mg/dL (ref 8.9–10.3)
Chloride: 103 mmol/L (ref 98–111)
Creatinine, Ser: 0.95 mg/dL (ref 0.44–1.00)
GFR calc Af Amer: >60 mL/min (ref >60)
GFR calc non Af Amer: >60 mL/min (ref >60)
Glucose, Bld: 120 mg/dL — ABNORMAL HIGH (ref 70–99)
Potassium: 2.8 mmol/L — ABNORMAL LOW (ref 3.5–5.1)
Sodium: 140 mmol/L (ref 135–145)
Total Bilirubin: 0.5 mg/dL (ref 0.3–1.2)
Total Protein: 7.6 g/dL (ref 6.5–8.1)

## 2019-05-19 LAB — CBC WITH DIFFERENTIAL/PLATELET
Abs Immature Granulocytes: 0.03 10*3/uL (ref 0.00–0.07)
Basophils Absolute: 0.1 10*3/uL (ref 0.0–0.1)
Basophils Relative: 1 %
Eosinophils Absolute: 0.2 10*3/uL (ref 0.0–0.5)
Eosinophils Relative: 2 %
HCT: 42.4 % (ref 36.0–46.0)
Hemoglobin: 14.1 g/dL (ref 12.0–15.0)
Immature Granulocytes: 0 %
Lymphocytes Relative: 37 %
Lymphs Abs: 3.2 10*3/uL (ref 0.7–4.0)
MCH: 27.9 pg (ref 26.0–34.0)
MCHC: 33.3 g/dL (ref 30.0–36.0)
MCV: 84 fL (ref 80.0–100.0)
Monocytes Absolute: 0.4 10*3/uL (ref 0.1–1.0)
Monocytes Relative: 5 %
Neutro Abs: 4.9 10*3/uL (ref 1.7–7.7)
Neutrophils Relative %: 55 %
Platelets: 232 10*3/uL (ref 150–400)
RBC: 5.05 MIL/uL (ref 3.87–5.11)
RDW: 12.3 % (ref 11.5–15.5)
WBC: 8.8 10*3/uL (ref 4.0–10.5)
nRBC: 0 % (ref 0.0–0.2)

## 2019-05-19 NOTE — ED Triage Notes (Signed)
C/O dizziness, generalized aches and pain last week. Stated hx of high blood pressure,

## 2019-05-20 MED ORDER — NAPROXEN 250 MG PO TABS
375.0000 mg | ORAL_TABLET | Freq: Once | ORAL | Status: AC
Start: 2019-05-20 — End: 2019-05-20
  Administered 2019-05-20: 02:00:00 375 mg via ORAL
  Filled 2019-05-20: qty 2

## 2019-05-20 MED ORDER — POTASSIUM CHLORIDE CRYS ER 20 MEQ PO TBCR: 20.0000 meq | EXTENDED_RELEASE_TABLET | Freq: Every day | ORAL | 0 refills | Status: DC

## 2019-05-20 MED ORDER — POTASSIUM CHLORIDE CRYS ER 20 MEQ PO TBCR
40.0000 meq | EXTENDED_RELEASE_TABLET | Freq: Once | ORAL | Status: AC
  Administered 2019-05-20: 40 meq via ORAL
  Filled 2019-05-20: qty 2

## 2019-05-20 NOTE — ED Provider Notes (Signed)
[facility] EMERGENCY DEPARTMENT Provider Note  CSN: [id number] Arrival date & time: 05/19/19 1601  Chief Complaint(s) Dizziness  HPI Dawn Stoy is a 64 y.o. female with a history of hypertension who presents to the emergency department for 1 week of muscle aches.  This began slowly and gradually got worse over the week.  She reports that she works [employer], [employer].  Pain is located throughout her body mostly in her back and legs.  She denies any falls or trauma.  No recent fevers or infections.  No coughing or congestion.  No nausea or vomiting.  No diarrhea.  No urinary symptoms.  She was brought in by her friend who is with her at home and checked her blood pressure and noted that it was elevated.  The history is limited by a language barrier. Language interpreter used: daughter assisted. no interpreter available for [place].    Past Medical History Past Medical History:  Diagnosis Date  . Elevated BP without diagnosis of hypertension   . Hypertension    Patient Active Problem List   Diagnosis Date Noted  . CHF (congestive heart failure) ([place]) 07/22/2016  . Chest pain 07/22/2016   Home Medication(s) Prior to Admission medications   Medication Sig Start Date End Date Taking? Authorizing Provider  Aspirin-Salicylamide-Caffeine (BC HEADACHE POWDER PO) Take by mouth.    [provider]  furosemide (LASIX) 20 MG tablet Take 1 tablet (20 mg total) by mouth daily. 07/17/16   Forde Dandy, MD  ibuprofen (ADVIL,MOTRIN) 600 MG tablet Take 1 tablet (600 mg total) by mouth every 6 (six) hours as needed. 04/28/18   Fransico Meadow, PA-C  potassium chloride SA (KLOR-CON) 20 MEQ tablet Take 1 tablet (20 mEq total) by mouth daily. 05/20/19   Fatima Blank, MD                                                                                                                                    Past Surgical History Past Surgical History:  Procedure Laterality Date  .  CESAREAN SECTION     Family History Family History  Problem Relation Age of Onset  . Chorea Father     Social History Social History   Tobacco Use  . Smoking status: Current Every Day Smoker  . Smokeless tobacco: Never Used  Substance Use Topics  . Alcohol use: Yes  . Drug use: Not on file   Allergies Patient has no known allergies.  Review of Systems Review of Systems All other systems are reviewed and are negative for acute change except as noted in the HPI  Physical Exam Vital Signs  I have reviewed the triage vital signs BP 133/78 (BP Location: Left Arm)   Pulse 82   Temp 98.3 F (36.8 C) (Oral)   Resp 18   Wt 63.5 kg   SpO2 99%   BMI 25.61 kg/m   Physical Exam  Vitals reviewed.  Constitutional:      General: She is not in acute distress.    Appearance: She is well-developed. She is not diaphoretic.  HENT:     Head: Normocephalic and atraumatic.     Nose: Nose normal.  Eyes:     General: No scleral icterus.       Right eye: No discharge.        Left eye: No discharge.     Conjunctiva/sclera: Conjunctivae normal.     Pupils: Pupils are equal, round, and reactive to light.  Cardiovascular:     Rate and Rhythm: Normal rate and regular rhythm.     Heart sounds: No murmur. No friction rub. No gallop.   Pulmonary:     Effort: Pulmonary effort is normal. No respiratory distress.     Breath sounds: Normal breath sounds. No stridor. No rales.  Abdominal:     General: There is no distension.     Palpations: Abdomen is soft.     Tenderness: There is no abdominal tenderness.  Musculoskeletal:     Cervical back: Normal range of motion and neck supple.     Thoracic back: Tenderness (mild) present. No bony tenderness.     Lumbar back: Tenderness (mild) present. No bony tenderness.       Back:     Right upper leg: Tenderness (mild) present.     Left upper leg: Tenderness (mild) present.  Skin:    General: Skin is warm and dry.     Findings: No erythema or  rash.  Neurological:     Mental Status: She is alert and oriented to person, place, and time.     ED Results and Treatments Labs (all labs ordered are listed, but only abnormal results are displayed) Labs Reviewed  COMPREHENSIVE METABOLIC PANEL - Abnormal; Notable for the following components:      Result Value   Potassium 2.8 (*)    Glucose, Bld 120 (*)    BUN 7 (*)    All other components within normal limits  CBC WITH DIFFERENTIAL/PLATELET                                                                                                                         EKG   EKG Interpretation  Date/Time:  Thursday May 20 2019 02:33:13 EDT Ventricular Rate:  67 PR Interval:    QRS Duration: 96 QT Interval:  430 QTC Calculation: 454 R Axis:   71 Text Interpretation: Sinus rhythm No acute changes Confirmed by Addison Lank [phone]) on 05/20/2019 2:58:41 AM      Radiology DG Chest 2 View  Result Date: 05/19/2019 CLINICAL DATA:  Dizziness, pain EXAM: CHEST - 2 VIEW COMPARISON:  07/17/2016 FINDINGS: Heart and mediastinal contours are within normal limits. No focal opacities or effusions. No acute bony abnormality. IMPRESSION: No active cardiopulmonary disease. Electronically Signed   By: Rolm Baptise M.D.   On: 05/19/2019 17:37    Pertinent labs & imaging  results that were available during my care of the patient were reviewed by me and considered in my medical decision making (see chart for details).  Medications Ordered in ED Medications  naproxen (NAPROSYN) tablet 375 mg (375 mg Oral Given 05/20/19 0218)  potassium chloride SA (KLOR-CON) CR tablet 40 mEq (40 mEq Oral Given 05/20/19 0228)                                                                                                                                    Procedures Procedures  (including critical care time)  Medical Decision Making / ED Course I have reviewed the nursing notes for this encounter and the patient's prior  records (if available in EHR or on provided paperwork).   Dawn [name] was evaluated in Emergency Department on 05/20/2019 for the symptoms described in the history of present illness. She was evaluated in the context of the global COVID-19 pandemic, which necessitated consideration that the patient might be at risk for infection with the SARS-CoV-2 virus that causes COVID-19. Institutional protocols and algorithms that pertain to the evaluation of patients at risk for COVID-19 are in a state of rapid change based on information released by regulatory bodies including the CDC and federal and state organizations. These policies and algorithms were followed during the patient's care in the ED.  Muscle aches, gradual onset. The patient appears well, in no acute distress, without evidence of toxicity or dehydration.  Labs notable for mild hypokalemia.  No other electrolyte derangements or renal insufficiency.  No anemia.  Blood pressure improved without intervention here.  Potassium repleted orally. Rx'd.       Final Clinical Impression(s) / ED Diagnoses Final diagnoses:  Muscle ache  Secondary hypertension  Hypokalemia   The patient appears reasonably screened and/or stabilized for discharge and I doubt any other medical condition or other [facility] requiring further screening, evaluation, or treatment in the ED at this time prior to discharge. Safe for discharge with strict return precautions.  Disposition: Discharge  Condition: Good  I have discussed the results, Dx and Tx plan with the patient/family who expressed understanding and agree(s) with the plan. Discharge instructions discussed at length. The patient/family was given strict return precautions who verbalized understanding of the instructions. No further questions at time of discharge.    ED Discharge Orders         Ordered    potassium chloride SA (KLOR-CON) 20 MEQ tablet  Daily     05/20/19 0223           Follow Up: Primary  care provider  Schedule an appointment as soon as possible for a visit  If you do not have a primary care physician, contact [facility] at [phone] for referral      This chart was dictated using voice recognition software.  Despite best efforts to proofread,  errors can occur which can change the documentation meaning.   Arlethia Basso,  Grayce Sessions, MD 05/20/19 0300

## 2019-05-20 NOTE — Discharge Instructions (Signed)
During the work up, we noted that your potassium was mildly low, I have prescribed 2 weeks of potassium.

## 2019-06-04 ENCOUNTER — Ambulatory Visit: Payer: Self-pay | Admitting: Family Medicine

## 2019-06-04 ENCOUNTER — Encounter: Payer: Self-pay | Admitting: Family Medicine

## 2019-06-04 ENCOUNTER — Other Ambulatory Visit: Payer: Self-pay

## 2019-06-04 VITALS — BP 148/76 | HR 82 | Temp 97.1°F | Ht 61.0 in | Wt 124.4 lb

## 2019-06-04 DIAGNOSIS — M791 Myalgia, unspecified site: Secondary | ICD-10-CM

## 2019-06-04 DIAGNOSIS — M2550 Pain in unspecified joint: Secondary | ICD-10-CM | POA: Insufficient documentation

## 2019-06-04 DIAGNOSIS — I10 Essential (primary) hypertension: Secondary | ICD-10-CM

## 2019-06-04 DIAGNOSIS — Z0000 Encounter for general adult medical examination without abnormal findings: Secondary | ICD-10-CM

## 2019-06-04 DIAGNOSIS — E876 Hypokalemia: Secondary | ICD-10-CM

## 2019-06-04 LAB — URINALYSIS, ROUTINE W REFLEX MICROSCOPIC
Bilirubin Urine: NEGATIVE
Hgb urine dipstick: NEGATIVE
Ketones, ur: NEGATIVE
Leukocytes,Ua: NEGATIVE
Nitrite: NEGATIVE
Specific Gravity, Urine: <=1.005 — AB (ref 1.000–1.030)
Total Protein, Urine: NEGATIVE
Urine Glucose: NEGATIVE
Urobilinogen, UA: 0.2 (ref 0.0–1.0)
pH: 6 (ref 5.0–8.0)

## 2019-06-04 LAB — BASIC METABOLIC PANEL
BUN: 7 mg/dL (ref 6–23)
CO2: 27 mEq/L (ref 19–32)
Calcium: 9.1 mg/dL (ref 8.4–10.5)
Chloride: 105 mEq/L (ref 96–112)
Creatinine, Ser: 0.88 mg/dL (ref 0.40–1.20)
GFR: 64.88 mL/min (ref >60.00)
Glucose, Bld: 127 mg/dL — ABNORMAL HIGH (ref 70–99)
Potassium: 3.4 mEq/L — ABNORMAL LOW (ref 3.5–5.1)
Sodium: 140 mEq/L (ref 135–145)

## 2019-06-04 LAB — CBC
HCT: 39.8 % (ref 36.0–46.0)
Hemoglobin: 13.5 g/dL (ref 12.0–15.0)
MCHC: 33.9 g/dL (ref 30.0–36.0)
MCV: 84 fl (ref 78.0–100.0)
Platelets: 230 10*3/uL (ref 150.0–400.0)
RBC: 4.74 Mil/uL (ref 3.87–5.11)
RDW: 12.8 % (ref 11.5–15.5)
WBC: 9.9 10*3/uL (ref 4.0–10.5)

## 2019-06-04 LAB — LDL CHOLESTEROL, DIRECT: Direct LDL: 81 mg/dL

## 2019-06-04 LAB — SEDIMENTATION RATE: Sed Rate: 57 mm/hr — ABNORMAL HIGH (ref 0–30)

## 2019-06-04 LAB — TSH: TSH: 0.85 u[IU]/mL (ref 0.35–4.50)

## 2019-06-04 MED ORDER — LOSARTAN POTASSIUM 50 MG PO TABS: 50.0000 mg | ORAL_TABLET | Freq: Every day | ORAL | 3 refills | Status: DC

## 2019-06-04 NOTE — Progress Notes (Signed)
New Patient Office Visit  Subjective:  Patient ID: Dawn [name], female    DOB: October 20, 1955  Age: 64 y.o. MRN: [id number]  CC:  Chief Complaint  Patient presents with  . Establish Care    New patient, c/o pain all over body sometimes feel like she will fall when walking x 2 months.     HPI Dawn [name] presents for establishment of care with multiple medical issues and problems.  She is accompanied by her daughter and a Optometrist.  Recently seen in the emergency room with generalized body aches and pains from her ankles all the way up through her head.  It seems to be the most of the pain is in her muscles.  She tells that her joints are somewhat stiff after 6 sitting for a while.  She denies swelling or erythema in her joints.  She is taking Tylenol with some relief.  Left side seems to be more affected than the right with the pain.  She is right-hand dominant.  She works a Economist.  History of hypertension that is been treated with Lasix.  Potassium was low and her emergency room visit a few weeks ago.  She was given a prescription for supplemental potassium to take.  She is no longer taking the Lasix or the potassium.  She reports pain in her teeth and is currently also seeing a dentist.  She complains of constipation.  Distant history of blood in her stool but none recently.  She is postmenopausal.  She is status post complete hysterectomy for what sounds like a uterine prolapse associated with pregnancy? She does smoke. She has no ho chf.  Normal echocardiogram in 2018.  Past Medical History:  Diagnosis Date  . Elevated BP without diagnosis of hypertension   . Hypertension     Past Surgical History:  Procedure Laterality Date  . CESAREAN SECTION      Family History  Problem Relation Age of Onset  . Chorea Father     Social History   Socioeconomic History  . Marital status: Married    Spouse name: Not on file  . Number of children: 3  . Years of education: Not on file    . Highest education level: Not on file  Occupational History  . Not on file  Tobacco Use  . Smoking status: Current Every Day Smoker  . Smokeless tobacco: Never Used  Substance and Sexual Activity  . Alcohol use: Never  . Drug use: Never  . Sexual activity: Not on file  Other Topics Concern  . Not on file  Social History Narrative  . Not on file   Social Determinants of Health   Financial Resource Strain:   . Difficulty of Paying Living Expenses:   Food Insecurity:   . Worried About Charity fundraiser in the Last Year:   . Arboriculturist in the Last Year:   Transportation Needs:   . Film/video editor (Medical):   Marland Kitchen Lack of Transportation (Non-Medical):   Physical Activity:   . Days of Exercise per Week:   . Minutes of Exercise per Session:   Stress:   . Feeling of Stress :   Social Connections:   . Frequency of Communication with Friends and Family:   . Frequency of Social Gatherings with Friends and Family:   . Attends Religious Services:   . Active Member of Clubs or Organizations:   . Attends Archivist Meetings:   Marland Kitchen Marital Status:  Intimate Partner Violence:   . Fear of Current or Ex-Partner:   . Emotionally Abused:   Marland Kitchen Physically Abused:   . Sexually Abused:     ROS Review of Systems  Constitutional: Negative for chills, diaphoresis, fatigue and fever.  HENT: Negative.   Eyes: Negative for photophobia and visual disturbance.  Respiratory: Negative.   Cardiovascular: Negative.   Gastrointestinal: Positive for constipation. Negative for abdominal pain, anal bleeding and blood in stool.  Endocrine: Negative for polyphagia and polyuria.  Genitourinary: Negative for difficulty urinating, dysuria, frequency, urgency, vaginal bleeding and vaginal discharge.  Musculoskeletal: Positive for myalgias.  Allergic/Immunologic: Negative for immunocompromised state.  Neurological: Negative for weakness and numbness.  Hematological: Does not  bruise/bleed easily.  Psychiatric/Behavioral: Negative.     Objective:   Today's Vitals: BP (!) 148/76   Pulse 82   Temp (!) 97.1 F (36.2 C) (Tympanic)   Ht 5\' 1"  (1.549 m)   Wt 124 lb 6.4 oz (56.4 kg)   SpO2 98%   BMI 23.51 kg/m   Physical Exam Constitutional:      General: She is not in acute distress.    Appearance: Normal appearance. She is normal weight. She is not ill-appearing, toxic-appearing or diaphoretic.  HENT:     Head: Normocephalic and atraumatic.     Right Ear: Tympanic membrane, ear canal and external ear normal.     Left Ear: Tympanic membrane, ear canal and external ear normal.     Mouth/Throat:     Mouth: Mucous membranes are moist.     Pharynx: No oropharyngeal exudate or posterior oropharyngeal erythema.  Eyes:     General: No scleral icterus.       Right eye: No discharge.        Left eye: No discharge.     Extraocular Movements: Extraocular movements intact.     Conjunctiva/sclera: Conjunctivae normal.     Pupils: Pupils are equal, round, and reactive to light.  Cardiovascular:     Rate and Rhythm: Normal rate and regular rhythm.  Pulmonary:     Effort: Pulmonary effort is normal.     Breath sounds: Normal breath sounds.  Abdominal:     General: Abdomen is flat. Bowel sounds are normal. There is no distension.     Palpations: Abdomen is soft. There is no mass.     Tenderness: There is no abdominal tenderness. There is no guarding or rebound.     Hernia: No hernia is present.  Musculoskeletal:        General: No swelling, tenderness or deformity.     Right shoulder: Normal.     Left shoulder: Normal.     Cervical back: Normal. No tenderness or bony tenderness. Normal range of motion.     Thoracic back: Normal. No tenderness or bony tenderness. Normal range of motion.     Lumbar back: Normal. No tenderness or bony tenderness. Normal range of motion.     Right hip: No tenderness. Decreased range of motion.     Left hip: No tenderness.  Decreased range of motion.     Right knee: Normal. Normal range of motion. No tenderness.     Left knee: Normal. Normal range of motion. No tenderness.     Right lower leg: No swelling or tenderness. No edema.     Left lower leg: No swelling or tenderness. No edema.     Right ankle: Normal. No swelling. No tenderness. Normal range of motion.     Left ankle: Normal.  No swelling. No tenderness. Normal range of motion.  Lymphadenopathy:     Cervical: No cervical adenopathy.  Skin:    General: Skin is warm and dry.  Neurological:     Mental Status: She is alert and oriented to person, place, and time.  Psychiatric:        Mood and Affect: Mood normal.        Behavior: Behavior normal.     Assessment & Plan:   Problem List Items Addressed This Visit      Cardiovascular and Mediastinum   Essential hypertension   Relevant Medications   losartan (COZAAR) 50 MG tablet   Other Relevant Orders   Basic metabolic panel     Other   Myalgia   Relevant Orders   TSH   Sedimentation rate   Healthcare maintenance - Primary   Relevant Orders   MM Digital Screening   Ambulatory referral to Gastroenterology   CBC   Basic metabolic panel   LDL cholesterol, direct   Urinalysis, Routine w reflex microscopic   Hypokalemia   Relevant Orders   Basic metabolic panel      Outpatient Encounter Medications as of 06/04/2019  Medication Sig  . Aspirin-Salicylamide-Caffeine (BC HEADACHE POWDER PO) Take by mouth.  Marland Kitchen ibuprofen (ADVIL,MOTRIN) 600 MG tablet Take 1 tablet (600 mg total) by mouth every 6 (six) hours as needed.  Marland Kitchen losartan (COZAAR) 50 MG tablet Take 1 tablet (50 mg total) by mouth daily.  . [DISCONTINUED] furosemide (LASIX) 20 MG tablet Take 1 tablet (20 mg total) by mouth daily. (Patient not taking: Reported on 06/04/2019)  . [DISCONTINUED] potassium chloride SA (KLOR-CON) 20 MEQ tablet Take 1 tablet (20 mEq total) by mouth daily. (Patient not taking: Reported on 06/04/2019)   No  facility-administered encounter medications on file as of 06/04/2019.    Follow-up: Return in about 1 month (around 07/04/2019).   Advised to stop smoking.  Libby Maw, MD

## 2019-06-21 ENCOUNTER — Other Ambulatory Visit: Payer: Self-pay

## 2019-06-21 ENCOUNTER — Ambulatory Visit (AMBULATORY_SURGERY_CENTER): Payer: Self-pay | Admitting: *Deleted

## 2019-06-21 VITALS — Temp 97.3°F | Ht 61.0 in | Wt 124.0 lb

## 2019-06-21 DIAGNOSIS — Z1211 Encounter for screening for malignant neoplasm of colon: Secondary | ICD-10-CM

## 2019-06-21 DIAGNOSIS — Z01818 Encounter for other preprocedural examination: Secondary | ICD-10-CM

## 2019-06-21 MED ORDER — NA SULFATE-K SULFATE-MG SULF 17.5-3.13-1.6 GM/177ML PO SOLN: ORAL | 0 refills | Status: DC

## 2019-06-21 NOTE — Progress Notes (Signed)
Patient is here in-person for PV, also daughter and interpreter with pt in [place]. Patient denies any allergies to eggs or soy. Patient denies any problems with anesthesia/sedation. Patient denies any oxygen use at home. Patient denies taking any diet/weight loss medications or blood thinners. Patient is not being treated for MRSA or C-diff. Patient is aware of our care-partner policy and 0000000 safety protocol. COVID-19 screening test is on 06/24/19, the pt is aware. Patient c/o pain left side of body to her head, me and the interpreter encouraged the patient and daughter to contact her PCP to let them know about this.

## 2019-06-22 ENCOUNTER — Ambulatory Visit (HOSPITAL_BASED_OUTPATIENT_CLINIC_OR_DEPARTMENT_OTHER)
Admission: RE | Admit: 2019-06-22 | Discharge: 2019-06-22 | Disposition: A | Discharge status: Home / Self Care | Payer: Medicaid Other | Source: Ambulatory Visit | Attending: Family Medicine | Admitting: Family Medicine

## 2019-06-22 DIAGNOSIS — Z1231 Encounter for screening mammogram for malignant neoplasm of breast: Secondary | ICD-10-CM | POA: Insufficient documentation

## 2019-06-22 DIAGNOSIS — Z0000 Encounter for general adult medical examination without abnormal findings: Secondary | ICD-10-CM

## 2019-06-24 ENCOUNTER — Ambulatory Visit (INDEPENDENT_AMBULATORY_CARE_PROVIDER_SITE_OTHER): Payer: Self-pay

## 2019-06-24 DIAGNOSIS — Z1159 Encounter for screening for other viral diseases: Secondary | ICD-10-CM

## 2019-06-25 LAB — SARS CORONAVIRUS 2 (TAT 6-24 HRS): SARS Coronavirus 2: NEGATIVE

## 2019-06-28 ENCOUNTER — Encounter: Payer: Self-pay | Admitting: Gastroenterology

## 2019-06-28 ENCOUNTER — Other Ambulatory Visit: Payer: Self-pay

## 2019-06-28 ENCOUNTER — Ambulatory Visit (AMBULATORY_SURGERY_CENTER): Payer: Medicaid Other | Admitting: Gastroenterology

## 2019-06-28 VITALS — BP 131/57 | HR 52 | Temp 96.9°F | Resp 17 | Ht 61.0 in | Wt 124.0 lb

## 2019-06-28 DIAGNOSIS — D122 Benign neoplasm of ascending colon: Secondary | ICD-10-CM

## 2019-06-28 DIAGNOSIS — Z1211 Encounter for screening for malignant neoplasm of colon: Secondary | ICD-10-CM

## 2019-06-28 MED ORDER — SODIUM CHLORIDE 0.9 % IV SOLN: 500.0000 mL | Freq: Once | INTRAVENOUS | Status: DC

## 2019-06-28 NOTE — Progress Notes (Signed)
Pt's states no medical or surgical changes since previsit or office visit. 

## 2019-06-28 NOTE — Progress Notes (Signed)
Called to room to assist during endoscopic procedure.  Patient ID and intended procedure confirmed with present staff. Received instructions for my participation in the procedure from the performing physician.  

## 2019-06-28 NOTE — Progress Notes (Signed)
A/ox3, pleased with MAC, report to RN 

## 2019-06-28 NOTE — Patient Instructions (Signed)
Information on polyps and hemorrhoids given to you today.  Await pathology results.  Continue present diet and medications.  YOU HAD AN ENDOSCOPIC PROCEDURE TODAY AT THE [facility]:   Refer to the procedure report that was given to you for any specific questions about what was found during the examination.  If the procedure report does not answer your questions, please call your gastroenterologist to clarify.  If you requested that your care partner not be given the details of your procedure findings, then the procedure report has been included in a sealed envelope for you to review at your convenience later.  YOU SHOULD EXPECT: Some feelings of bloating in the abdomen. Passage of more gas than usual.  Walking can help get rid of the air that was put into your GI tract during the procedure and reduce the bloating. If you had a lower endoscopy (such as a colonoscopy or flexible sigmoidoscopy) you may notice spotting of blood in your stool or on the toilet paper. If you underwent a bowel prep for your procedure, you may not have a normal bowel movement for a few days.  Please Note:  You might notice some irritation and congestion in your nose or some drainage.  This is from the oxygen used during your procedure.  There is no need for concern and it should clear up in a day or so.  SYMPTOMS TO REPORT IMMEDIATELY:   Following lower endoscopy (colonoscopy or flexible sigmoidoscopy):  Excessive amounts of blood in the stool  Significant tenderness or worsening of abdominal pains  Swelling of the abdomen that is new, acute  Fever of 100F or higher   For urgent or emergent issues, a gastroenterologist can be reached at any hour by calling [phone]. Do not use MyChart messaging for urgent concerns.    DIET:  We do recommend a small meal at first, but then you may proceed to your regular diet.  Drink plenty of fluids but you should avoid alcoholic beverages for 24  hours.  ACTIVITY:  You should plan to take it easy for the rest of today and you should NOT DRIVE or use heavy machinery until tomorrow (because of the sedation medicines used during the test).    FOLLOW UP: Our staff will call the number listed on your records 48-72 hours following your procedure to check on you and address any questions or concerns that you may have regarding the information given to you following your procedure. If we do not reach you, we will leave a message.  We will attempt to reach you two times.  During this call, we will ask if you have developed any symptoms of COVID 19. If you develop any symptoms (ie: fever, flu-like symptoms, shortness of breath, cough etc.) before then, please call [phone].  If you test positive for Covid 19 in the 2 weeks post procedure, please call and report this information to us.    If any biopsies were taken you will be contacted by phone or by letter within the next 1-3 weeks.  Please call us at [phone] if you have not heard about the biopsies in 3 weeks.    SIGNATURES/CONFIDENTIALITY: You and/or your care partner have signed paperwork which will be entered into your electronic medical record.  These signatures attest to the fact that that the information above on your After Visit Summary has been reviewed and is understood.  Full responsibility of the confidentiality of this discharge information lies with you and/or   your care-partner. 

## 2019-06-28 NOTE — Progress Notes (Signed)
CW- vitals LS- temp 

## 2019-06-28 NOTE — Op Note (Signed)
[facility] Patient Name: Dawn [name] Procedure Date: 06/28/2019 8:25 AM MRN: [id number] Endoscopist: Thornton Park MD, MD Age: 64 Referring MD:  Date of Birth: 1955/09/15 Gender: Female Account #: [id number] Procedure:                Colonoscopy Indications:              Screening for colorectal malignant neoplasm, This                            is the patient's first colonoscopy                           No known family history of colon cancer or polyps Medicines:                Monitored Anesthesia Care Procedure:                Pre-Anesthesia Assessment:                           - Prior to the procedure, a History and Physical                            was performed, and patient medications and                            allergies were reviewed. The patient's tolerance of                            previous anesthesia was also reviewed. The risks                            and benefits of the procedure and the sedation                            options and risks were discussed with the patient.                            All questions were answered, and informed consent                            was obtained. Prior Anticoagulants: The patient has                            taken no previous anticoagulant or antiplatelet                            agents. ASA Grade Assessment: II - A patient with                            mild systemic disease. After reviewing the risks                            and benefits, the patient was deemed in  satisfactory condition to undergo the procedure.                           After obtaining informed consent, the colonoscope                            was passed under direct vision. Throughout the                            procedure, the patient's blood pressure, pulse, and                            oxygen saturations were monitored continuously. The                            Colonoscope was introduced  through the anus and                            advanced to the 3 cm into the ileum. A second                            forward view of the right colon was performed. The                            colonoscopy was performed without difficulty. The                            patient tolerated the procedure well. The quality                            of the bowel preparation was excellent. The                            terminal ileum, ileocecal valve, appendiceal                            orifice, and rectum were photographed. Scope In: 8:34:36 AM Scope Out: 8:47:52 AM Scope Withdrawal Time: 0 hours 10 minutes 19 seconds  Total Procedure Duration: 0 hours 13 minutes 16 seconds  Findings:                 The perianal and digital rectal examinations were                            normal.                           Non-bleeding internal hemorrhoids were found. The                            hemorrhoids were small.                           A 2 mm polyp was found in the ascending colon. The  polyp was flat. The polyp was removed with a cold                            snare. Resection and retrieval were complete.                            Estimated blood loss was minimal.                           The exam was otherwise without abnormality on                            direct and retroflexion views. Complications:            No immediate complications. Estimated blood loss:                            Minimal. Estimated Blood Loss:     Estimated blood loss was minimal. Impression:               - Non-bleeding internal hemorrhoids.                           - One 2 mm polyp in the ascending colon, removed                            with a cold snare. Resected and retrieved.                           - The examination was otherwise normal on direct                            and retroflexion views. Recommendation:           - Patient has a contact number available  for                            emergencies. The signs and symptoms of potential                            delayed complications were discussed with the                            patient. Return to normal activities tomorrow.                            Written discharge instructions were provided to the                            patient.                           - Resume previous diet.                           - Continue present medications.                           -  Await pathology results.                           - Repeat colonoscopy date to be determined after                            pending pathology results are reviewed for                            surveillance.                           - Emerging evidence supports eating a diet of                            fruits, vegetables, grains, calcium, and yogurt                            while reducing red meat and alcohol may reduce the                            risk of colon cancer.                           - Thank you for allowing me to be involved in your                            colon cancer prevention. Thornton Park MD, MD 06/28/2019 8:52:14 AM This report has been signed electronically.

## 2019-06-30 ENCOUNTER — Telehealth: Payer: Self-pay | Admitting: *Deleted

## 2019-06-30 NOTE — Telephone Encounter (Signed)
  Follow up Call-  Call back number 06/28/2019  Post procedure Call Back phone  # [phone] (daughter)  Permission to leave phone message Yes  Some recent data might be hidden     Patient questions:  Do you have a fever, pain , or abdominal swelling? No. Pain Score  0 *  Have you tolerated food without any problems? Yes.    Have you been able to return to your normal activities? Yes.    Do you have any questions about your discharge instructions: Diet   No. Medications  No. Follow up visit  No.  Do you have questions or concerns about your Care? No.  Actions: * If pain score is 4 or above: No action needed, pain <4.  1. Have you developed a fever since your procedure? no  2.   Have you had an respiratory symptoms (SOB or cough) since your procedure? no  3.   Have you tested positive for COVID 19 since your procedure no  4.   Have you had any family members/close contacts diagnosed with the COVID 19 since your procedure?  no   If yes to any of these questions please route to Joylene John, RN and Erenest Rasher, RN

## 2019-07-01 ENCOUNTER — Encounter: Payer: Self-pay | Admitting: Gastroenterology

## 2019-07-08 ENCOUNTER — Other Ambulatory Visit: Payer: Self-pay

## 2019-07-08 NOTE — Patient Instructions (Signed)
Health Maintenance Due  Topic Date Due  . Hepatitis C Screening  Never done  . COVID-19 Vaccine (1) Never done  . HIV Screening  Never done  . TETANUS/TDAP  Never done  . PAP SMEAR-Modifier  Never done    Depression screen [facility] 2/9 06/04/2019 06/04/2019  Decreased Interest 0 0  Down, Depressed, Hopeless 0 0  PHQ - 2 Score 0 0  Altered sleeping 0 -  Tired, decreased energy 1 -  Change in appetite 0 -  Feeling bad or failure about yourself  0 -  Trouble concentrating 0 -  Moving slowly or fidgety/restless 0 -  Suicidal thoughts 0 -  PHQ-9 Score 1 -  Difficult doing work/chores Not difficult at all -

## 2019-07-09 ENCOUNTER — Encounter: Payer: Self-pay | Admitting: Family Medicine

## 2019-07-09 ENCOUNTER — Ambulatory Visit: Payer: Medicaid Other | Admitting: Family Medicine

## 2019-07-09 VITALS — BP 150/80 | HR 92 | Temp 97.8°F | Ht 61.0 in | Wt 119.4 lb

## 2019-07-09 DIAGNOSIS — I10 Essential (primary) hypertension: Secondary | ICD-10-CM

## 2019-07-09 DIAGNOSIS — R1013 Epigastric pain: Secondary | ICD-10-CM | POA: Insufficient documentation

## 2019-07-09 LAB — HEPATIC FUNCTION PANEL
ALT: 13 U/L (ref 0–35)
AST: 21 U/L (ref 0–37)
Albumin: 4.4 g/dL (ref 3.5–5.2)
Alkaline Phosphatase: 80 U/L (ref 39–117)
Bilirubin, Direct: 0.1 mg/dL (ref 0.0–0.3)
Total Bilirubin: 0.6 mg/dL (ref 0.2–1.2)
Total Protein: 7.8 g/dL (ref 6.0–8.3)

## 2019-07-09 LAB — AMYLASE: Amylase: 66 U/L (ref 27–131)

## 2019-07-09 LAB — BASIC METABOLIC PANEL
BUN: 4 mg/dL — ABNORMAL LOW (ref 6–23)
CO2: 32 mEq/L (ref 19–32)
Calcium: 9.7 mg/dL (ref 8.4–10.5)
Chloride: 103 mEq/L (ref 96–112)
Creatinine, Ser: 0.77 mg/dL (ref 0.40–1.20)
GFR: 75.66 mL/min (ref >60.00)
Glucose, Bld: 128 mg/dL — ABNORMAL HIGH (ref 70–99)
Potassium: 3.4 mEq/L — ABNORMAL LOW (ref 3.5–5.1)
Sodium: 142 mEq/L (ref 135–145)

## 2019-07-09 LAB — C-REACTIVE PROTEIN: CRP: <1 mg/dL (ref 0.5–20.0)

## 2019-07-09 LAB — SEDIMENTATION RATE: Sed Rate: 35 mm/hr — ABNORMAL HIGH (ref 0–30)

## 2019-07-09 MED ORDER — LOSARTAN POTASSIUM 100 MG PO TABS: 100.0000 mg | ORAL_TABLET | Freq: Every day | ORAL | 3 refills | Status: DC

## 2019-07-09 NOTE — Progress Notes (Signed)
Established Patient Office Visit  Subjective:  Patient ID: Dawn [name], female    DOB: 1955-04-05  Age: 64 y.o. MRN: [id number]  CC:  Chief Complaint  Patient presents with  . Hypertension    1 month follow up.  Pt c/o pain starting from both legs going up to her head.  Pt said that there has not been a change since last visit.  Pt explains that she can not stand , she feels weak and lost of balance when she does stand.  Pt also c/o chest tightness.    HPI Dawn [name] presents for follow-up of her hypertension.  Tolerating the losartan and taking it each day at 7:15 AM.  Reports pains that move from the back of her legs all the way up to the back of her head.  Denies any recent injuries.  No changes in her bowel or bladder function.  She is having some left lateral thigh pain.  Past Medical History:  Diagnosis Date  . Elevated BP without diagnosis of hypertension   . Hypertension     Past Surgical History:  Procedure Laterality Date  . CESAREAN SECTION      Family History  Problem Relation Age of Onset  . Chorea Father   . Colon cancer Neg Hx   . Esophageal cancer Neg Hx   . Rectal cancer Neg Hx   . Stomach cancer Neg Hx     Social History   Socioeconomic History  . Marital status: Married    Spouse name: Not on file  . Number of children: 3  . Years of education: Not on file  . Highest education level: Not on file  Occupational History  . Not on file  Tobacco Use  . Smoking status: Former Smoker    Types: Cigarettes  . Smokeless tobacco: Never Used  . Tobacco comment: Quit 2 months ago per pt  Substance and Sexual Activity  . Alcohol use: Never  . Drug use: Never  . Sexual activity: Not on file  Other Topics Concern  . Not on file  Social History Narrative  . Not on file   Social Determinants of Health   Financial Resource Strain:   . Difficulty of Paying Living Expenses:   Food Insecurity:   . Worried About Charity fundraiser in the Last Year:   . Arts development officer in the Last Year:   Transportation Needs:   . Film/video editor (Medical):   Marland Kitchen Lack of Transportation (Non-Medical):   Physical Activity:   . Days of Exercise per Week:   . Minutes of Exercise per Session:   Stress:   . Feeling of Stress :   Social Connections:   . Frequency of Communication with Friends and Family:   . Frequency of Social Gatherings with Friends and Family:   . Attends Religious Services:   . Active Member of Clubs or Organizations:   . Attends Archivist Meetings:   Marland Kitchen Marital Status:   Intimate Partner Violence:   . Fear of Current or Ex-Partner:   . Emotionally Abused:   Marland Kitchen Physically Abused:   . Sexually Abused:     Outpatient Medications Prior to Visit  Medication Sig Dispense Refill  . acetaminophen (TYLENOL) 500 MG tablet Take 500 mg by mouth every 6 (six) hours as needed.    Marland Kitchen losartan (COZAAR) 50 MG tablet Take 1 tablet (50 mg total) by mouth daily. 90 tablet 3   No facility-administered medications  prior to visit.    No Known Allergies  ROS Review of Systems  Constitutional: Positive for fatigue and unexpected weight change. Negative for chills and fever.  HENT: Negative.   Eyes: Negative for photophobia.  Respiratory: Negative.   Cardiovascular: Negative.   Gastrointestinal: Positive for abdominal pain. Negative for blood in stool, constipation, diarrhea, nausea and vomiting.  Endocrine: Negative for polyphagia and polyuria.  Genitourinary: Negative.   Musculoskeletal: Positive for myalgias. Negative for back pain, gait problem, neck pain and neck stiffness.  Skin: Negative for pallor and rash.  Allergic/Immunologic: Negative for immunocompromised state.  Neurological: Negative for weakness and numbness.  Hematological: Does not bruise/bleed easily.  Psychiatric/Behavioral: Negative.       Objective:    Physical Exam  Constitutional: She is oriented to person, place, and time. She appears well-developed and  well-nourished. No distress.  HENT:  Head: Normocephalic and atraumatic.  Right Ear: External ear normal.  Left Ear: External ear normal.  Eyes: Conjunctivae are normal. Right eye exhibits no discharge. Left eye exhibits no discharge. No scleral icterus.  Neck: No JVD present. No tracheal deviation present. No thyromegaly present.  Cardiovascular: Normal rate, regular rhythm and normal heart sounds.  Pulmonary/Chest: Effort normal and breath sounds normal. No stridor.  Abdominal: Soft. Bowel sounds are normal. She exhibits no distension. There is abdominal tenderness. There is no guarding.  Musculoskeletal:        General: No edema.     Cervical back: Normal range of motion and neck supple. Normal.     Thoracic back: Normal.     Lumbar back: Normal.     Right hip: Normal.     Left hip: Normal.     Left upper leg: No swelling, edema, deformity, tenderness or bony tenderness.  Lymphadenopathy:    She has no cervical adenopathy.  Neurological: She is alert and oriented to person, place, and time. She has normal strength.  Skin: Skin is warm and dry. She is not diaphoretic.  Psychiatric: She has a normal mood and affect. Her behavior is normal.    BP (!) 150/80 (BP Location: Left Arm, Patient Position: Sitting, Cuff Size: Normal)   Pulse 92   Temp 97.8 F (36.6 C) (Temporal)   Ht 5\' 1"  (1.549 m)   Wt 119 lb 6.4 oz (54.2 kg)   SpO2 99%   BMI 22.56 kg/m  Wt Readings from Last 3 Encounters:  07/09/19 119 lb 6.4 oz (54.2 kg)  06/28/19 124 lb (56.2 kg)  06/21/19 124 lb (56.2 kg)     Health Maintenance Due  Topic Date Due  . Hepatitis C Screening  Never done  . COVID-19 Vaccine (1) Never done  . HIV Screening  Never done  . TETANUS/TDAP  Never done  . PAP SMEAR-Modifier  Never done    There are no preventive care reminders to display for this patient.  Lab Results  Component Value Date   TSH 0.85 06/04/2019   Lab Results  Component Value Date   WBC 9.9 06/04/2019    HGB 13.5 06/04/2019   HCT 39.8 06/04/2019   MCV 84.0 06/04/2019   PLT 230.0 06/04/2019   Lab Results  Component Value Date   NA 140 06/04/2019   K 3.4 (L) 06/04/2019   CO2 27 06/04/2019   GLUCOSE 127 (H) 06/04/2019   BUN 7 06/04/2019   CREATININE 0.88 06/04/2019   BILITOT 0.5 05/19/2019   ALKPHOS 81 05/19/2019   AST 22 05/19/2019   ALT 10 05/19/2019  PROT 7.6 05/19/2019   ALBUMIN 3.9 05/19/2019   CALCIUM 9.1 06/04/2019   ANIONGAP 12 05/19/2019   GFR 64.88 06/04/2019   No results found for: CHOL No results found for: HDL No results found for: LDLCALC No results found for: TRIG No results found for: CHOLHDL No results found for: HGBA1C    Assessment & Plan:   Problem List Items Addressed This Visit      Cardiovascular and Mediastinum   Essential hypertension - Primary   Relevant Medications   losartan (COZAAR) 100 MG tablet     Other   Epigastric pain   Relevant Orders   Amylase   Sedimentation rate   C-reactive protein   Hepatic function panel   Hepatitis C antibody   Basic metabolic panel   CT Abdomen Pelvis W Contrast      Meds ordered this encounter  Medications  . losartan (COZAAR) 100 MG tablet    Sig: Take 1 tablet (100 mg total) by mouth daily.    Dispense:  90 tablet    Refill:  3    Follow-up: Return in about 1 month (around 08/09/2019).    Libby Maw, MD

## 2019-07-13 LAB — HEPATITIS C ANTIBODY
Hepatitis C Ab: NONREACTIVE
SIGNAL TO CUT-OFF: 0.19 (ref <1.00)

## 2019-07-28 ENCOUNTER — Other Ambulatory Visit: Payer: Self-pay

## 2019-07-28 ENCOUNTER — Encounter (HOSPITAL_COMMUNITY): Payer: Self-pay

## 2019-07-28 ENCOUNTER — Ambulatory Visit (HOSPITAL_COMMUNITY)
Admission: EM | Admit: 2019-07-28 | Discharge: 2019-07-28 | Disposition: A | Discharge status: Home / Self Care | Payer: Medicaid Other | Attending: Urgent Care | Admitting: Urgent Care

## 2019-07-28 DIAGNOSIS — R5383 Other fatigue: Secondary | ICD-10-CM | POA: Insufficient documentation

## 2019-07-28 DIAGNOSIS — R030 Elevated blood-pressure reading, without diagnosis of hypertension: Secondary | ICD-10-CM

## 2019-07-28 DIAGNOSIS — Z79899 Other long term (current) drug therapy: Secondary | ICD-10-CM | POA: Insufficient documentation

## 2019-07-28 DIAGNOSIS — Z20822 Contact with and (suspected) exposure to covid-19: Secondary | ICD-10-CM | POA: Diagnosis not present

## 2019-07-28 DIAGNOSIS — R52 Pain, unspecified: Secondary | ICD-10-CM

## 2019-07-28 DIAGNOSIS — B349 Viral infection, unspecified: Secondary | ICD-10-CM | POA: Insufficient documentation

## 2019-07-28 DIAGNOSIS — I10 Essential (primary) hypertension: Secondary | ICD-10-CM | POA: Diagnosis not present

## 2019-07-28 DIAGNOSIS — R0789 Other chest pain: Secondary | ICD-10-CM | POA: Diagnosis not present

## 2019-07-28 DIAGNOSIS — Z87891 Personal history of nicotine dependence: Secondary | ICD-10-CM | POA: Insufficient documentation

## 2019-07-28 DIAGNOSIS — M791 Myalgia, unspecified site: Secondary | ICD-10-CM | POA: Diagnosis present

## 2019-07-28 NOTE — ED Triage Notes (Addendum)
Pt presents today with generalized body aches x1 day. Pt denies fever or chills. Pt denies sick contacts. Pt denies respiritory  symptoms. Pt has been treating with tylenol at home with out relief.

## 2019-07-28 NOTE — Discharge Instructions (Signed)
If your chest pain worsens, start to have belly pain, sweating, fast heart rate then please report to the emergency room as this could be a sign that you are having a heart event like a heart attack.  For now, I want you to use Tylenol at a dose of 500 mg to 650 mg once every 6 hours as needed for fever, body aches and pains.  Your COVID-19 testing is pending, recommend you stay at home until we can let you know about your results.

## 2019-07-28 NOTE — ED Provider Notes (Signed)
[place]   MRN: [id number] DOB: 1955-03-15  Subjective:   Dawn [name] is a 64 y.o. female presenting for 1 day hx of acute onset moderate malaise, body aches, fatigue,   No current facility-administered medications for this encounter.  Current Outpatient Medications:  .  acetaminophen (TYLENOL) 500 MG tablet, Take 500 mg by mouth every 6 (six) hours as needed., Disp: , Rfl:  .  losartan (COZAAR) 100 MG tablet, Take 1 tablet (100 mg total) by mouth daily., Disp: 90 tablet, Rfl: 3   No Known Allergies  Past Medical History:  Diagnosis Date  . Elevated BP without diagnosis of hypertension   . Hypertension      Past Surgical History:  Procedure Laterality Date  . CESAREAN SECTION      Family History  Problem Relation Age of Onset  . Chorea Father   . Colon cancer Neg Hx   . Esophageal cancer Neg Hx   . Rectal cancer Neg Hx   . Stomach cancer Neg Hx     Social History   Tobacco Use  . Smoking status: Former Smoker    Types: Cigarettes  . Smokeless tobacco: Never Used  . Tobacco comment: Quit 2 months ago per pt  Vaping Use  . Vaping Use: Never used  Substance Use Topics  . Alcohol use: Never  . Drug use: Never    Review of Systems  Constitutional: Positive for malaise/fatigue. Negative for fever.  HENT: Negative for congestion, ear pain, sinus pain and sore throat.   Eyes: Negative for discharge and redness.  Respiratory: Negative for cough, hemoptysis, shortness of breath and wheezing.   Cardiovascular: Positive for chest pain.  Gastrointestinal: Negative for abdominal pain, diarrhea, nausea and vomiting.  Genitourinary: Negative for dysuria, flank pain and hematuria.  Musculoskeletal: Positive for myalgias.  Skin: Negative for rash.  Neurological: Negative for dizziness, weakness and headaches.  Psychiatric/Behavioral: Negative for depression and substance abuse.     Objective:   Vitals: BP (!) 168/85 (BP Location: Right Arm)   Pulse 85    Temp 98.1 F (36.7 C) (Oral)   Resp 18   SpO2 100%   BP recheck was 174/78.   Physical Exam Constitutional:      General: She is not in acute distress.    Appearance: Normal appearance. She is well-developed. She is not ill-appearing, toxic-appearing or diaphoretic.  HENT:     Head: Normocephalic and atraumatic.     Nose: Nose normal.     Mouth/Throat:     Mouth: Mucous membranes are moist.     Pharynx: Oropharynx is clear. No oropharyngeal exudate or posterior oropharyngeal erythema.  Eyes:     General: No scleral icterus.       Right eye: No discharge.        Left eye: No discharge.     Extraocular Movements: Extraocular movements intact.     Pupils: Pupils are equal, round, and reactive to light.  Cardiovascular:     Rate and Rhythm: Normal rate and regular rhythm.     Pulses: Normal pulses.     Heart sounds: Normal heart sounds. No murmur heard.  No friction rub. No gallop.   Pulmonary:     Effort: Pulmonary effort is normal. No respiratory distress.     Breath sounds: Normal breath sounds. No stridor. No wheezing, rhonchi or rales.  Chest:     Chest wall: Tenderness (reproducible over area outlined) present.    Abdominal:     General: Bowel  sounds are normal. There is no distension.     Palpations: Abdomen is soft. There is no mass.     Tenderness: There is no abdominal tenderness. There is no right CVA tenderness, left CVA tenderness, guarding or rebound.  Musculoskeletal:        General: No swelling or tenderness.     Right lower leg: No edema.     Left lower leg: No edema.  Skin:    General: Skin is warm and dry.     Findings: No rash.  Neurological:     Mental Status: She is alert and oriented to person, place, and time.  Psychiatric:        Mood and Affect: Mood normal.        Behavior: Behavior normal.        Thought Content: Thought content normal.        Judgment: Judgment normal.     ED ECG REPORT   Date: 07/28/2019  Rate: 62bpm  Rhythm:  normal sinus rhythm  QRS Axis: normal  Intervals: normal  ST/T Wave abnormalities: Non-specific T-wave changes  Conduction Disutrbances:none  Narrative Interpretation: T-wave inverted in aVL but otherwise sinus rhythm at 62bpm. No acute ST-elevations, depression, arrhythmias.   Old EKG Reviewed: changes noted  I have personally reviewed the EKG tracing and agree with the computerized printout as noted.   Assessment and Plan :   PDMP not reviewed this encounter.  1. Viral syndrome   2. Body aches   3. Atypical chest pain   4. Essential hypertension   5. Elevated blood pressure reading     No acute findings on ekg, recommended close f/u with PCP and monitoring of HTN. Covid 19 testing pending, recommended supportive care for viral syndrome. Counseled patient on potential for adverse effects with medications prescribed/recommended today, strict ER and return-to-clinic precautions discussed, patient verbalized understanding.    Jaynee Eagles, PA-C 07/28/19 1828

## 2019-07-29 LAB — SARS CORONAVIRUS 2 (TAT 6-24 HRS): SARS Coronavirus 2: NEGATIVE

## 2019-08-02 ENCOUNTER — Ambulatory Visit
Admission: RE | Admit: 2019-08-02 | Discharge: 2019-08-02 | Disposition: A | Discharge status: Home / Self Care | Payer: Medicaid Other | Source: Ambulatory Visit | Attending: Family Medicine | Admitting: Family Medicine

## 2019-08-02 DIAGNOSIS — R1013 Epigastric pain: Secondary | ICD-10-CM

## 2019-08-02 MED ORDER — IOPAMIDOL (ISOVUE-300) INJECTION 61%
100.0000 mL | Freq: Once | INTRAVENOUS | Status: AC | PRN
  Administered 2019-08-02: 100 mL via INTRAVENOUS

## 2019-08-09 ENCOUNTER — Ambulatory Visit: Payer: Self-pay | Admitting: Family Medicine

## 2019-08-09 ENCOUNTER — Other Ambulatory Visit: Payer: Self-pay

## 2019-08-09 ENCOUNTER — Encounter: Payer: Self-pay | Admitting: Family Medicine

## 2019-08-09 VITALS — BP 136/70 | HR 83 | Temp 98.6°F | Ht 61.0 in | Wt 118.6 lb

## 2019-08-09 DIAGNOSIS — F418 Other specified anxiety disorders: Secondary | ICD-10-CM

## 2019-08-09 DIAGNOSIS — M791 Myalgia, unspecified site: Secondary | ICD-10-CM

## 2019-08-09 DIAGNOSIS — K219 Gastro-esophageal reflux disease without esophagitis: Secondary | ICD-10-CM

## 2019-08-09 DIAGNOSIS — I10 Essential (primary) hypertension: Secondary | ICD-10-CM

## 2019-08-09 MED ORDER — PREDNISONE 20 MG PO TABS: 20.0000 mg | ORAL_TABLET | Freq: Two times a day (BID) | ORAL | 0 refills | Status: DC

## 2019-08-09 MED ORDER — PANTOPRAZOLE SODIUM 20 MG PO TBEC: 20.0000 mg | DELAYED_RELEASE_TABLET | Freq: Every day | ORAL | 2 refills | Status: DC

## 2019-08-09 MED ORDER — MIRTAZAPINE 15 MG PO TABS: 15.0000 mg | ORAL_TABLET | Freq: Every day | ORAL | 2 refills | Status: DC

## 2019-08-09 NOTE — Progress Notes (Signed)
Established Patient Office Visit  Subjective:  Patient ID: Dawn [name], female    DOB: [birth year]  Age: 64 y.o. MRN: [id number]  CC:  Chief Complaint  Patient presents with  . Follow-up    1 month follow up,     HPI Hceo [place] presents for follow-up of her hypertension, midepigastric pain.  Blood pressures well controlled with the enalapril.  Having no issues taking it.  Midepigastric pain persists but is improving.  She continues to complain about pain that moves from the back of her legs up into her back and up her back and into the back of her head.  She has been taking Aleve and Tylenol without much relief.  Patient has had normal CT scan of her head neck and back.  Past Medical History:  Diagnosis Date  . Elevated BP without diagnosis of hypertension   . Hypertension     Past Surgical History:  Procedure Laterality Date  . CESAREAN SECTION      Family History  Problem Relation Age of Onset  . Chorea Father   . Colon cancer Neg Hx   . Esophageal cancer Neg Hx   . Rectal cancer Neg Hx   . Stomach cancer Neg Hx     Social History   Socioeconomic History  . Marital status: Married    Spouse name: Not on file  . Number of children: 3  . Years of education: Not on file  . Highest education level: Not on file  Occupational History  . Not on file  Tobacco Use  . Smoking status: Former Smoker    Types: Cigarettes  . Smokeless tobacco: Never Used  . Tobacco comment: Quit 2 months ago per pt  Vaping Use  . Vaping Use: Never used  Substance and Sexual Activity  . Alcohol use: Never  . Drug use: Never  . Sexual activity: Not on file  Other Topics Concern  . Not on file  Social History Narrative  . Not on file   Social Determinants of Health   Financial Resource Strain:   . Difficulty of Paying Living Expenses:   Food Insecurity:   . Worried About Charity fundraiser in the Last Year:   . Arboriculturist in the Last Year:   Transportation Needs:   . Lexicographer (Medical):   Marland Kitchen Lack of Transportation (Non-Medical):   Physical Activity:   . Days of Exercise per Week:   . Minutes of Exercise per Session:   Stress:   . Feeling of Stress :   Social Connections:   . Frequency of Communication with Friends and Family:   . Frequency of Social Gatherings with Friends and Family:   . Attends Religious Services:   . Active Member of Clubs or Organizations:   . Attends Archivist Meetings:   Marland Kitchen Marital Status:   Intimate Partner Violence:   . Fear of Current or Ex-Partner:   . Emotionally Abused:   Marland Kitchen Physically Abused:   . Sexually Abused:     Outpatient Medications Prior to Visit  Medication Sig Dispense Refill  . acetaminophen (TYLENOL) 500 MG tablet Take 500 mg by mouth every 6 (six) hours as needed.    Marland Kitchen ibuprofen (ADVIL) 200 MG tablet Take 200 mg by mouth every 6 (six) hours as needed.    Marland Kitchen losartan (COZAAR) 100 MG tablet Take 1 tablet (100 mg total) by mouth daily. 90 tablet 3   No facility-administered medications prior  to visit.    No Known Allergies  ROS Review of Systems  Constitutional: Negative.   Respiratory: Negative.   Cardiovascular: Negative.   Gastrointestinal: Positive for abdominal pain.  Musculoskeletal: Positive for arthralgias and myalgias.   Depression screen [facility] 2/9 06/04/2019 06/04/2019  Decreased Interest 0 0  Down, Depressed, Hopeless 0 0  PHQ - 2 Score 0 0  Altered sleeping 0 -  Tired, decreased energy 1 -  Change in appetite 0 -  Feeling bad or failure about yourself  0 -  Trouble concentrating 0 -  Moving slowly or fidgety/restless 0 -  Suicidal thoughts 0 -  PHQ-9 Score 1 -  Difficult doing work/chores Not difficult at all -   Depression screen [facility] 2/9 08/09/2019 06/04/2019 06/04/2019  Decreased Interest 1 0 0  Down, Depressed, Hopeless 2 0 0  PHQ - 2 Score 3 0 0  Altered sleeping 3 0 -  Tired, decreased energy 0 1 -  Change in appetite 1 0 -  Feeling bad or failure about  yourself  3 0 -  Trouble concentrating 3 0 -  Moving slowly or fidgety/restless 3 0 -  Suicidal thoughts 0 0 -  PHQ-9 Score 16 1 -  Difficult doing work/chores - Not difficult at all -      Objective:    Physical Exam Constitutional:      General: She is not in acute distress.    Appearance: Normal appearance. She is normal weight. She is not toxic-appearing or diaphoretic.  HENT:     Head: Normocephalic and atraumatic.     Right Ear: External ear normal.     Left Ear: External ear normal.  Eyes:     General: No scleral icterus.       Right eye: No discharge.        Left eye: No discharge.     Conjunctiva/sclera: Conjunctivae normal.  Pulmonary:     Effort: Pulmonary effort is normal.  Neurological:     Mental Status: She is alert and oriented to person, place, and time.  Psychiatric:        Mood and Affect: Mood normal.        Behavior: Behavior normal.     BP 136/70   Pulse 83   Temp 98.6 F (37 C) (Tympanic)   Ht 5\' 1"  (1.549 m)   Wt 118 lb 9.6 oz (53.8 kg)   SpO2 97%   BMI 22.41 kg/m  Wt Readings from Last 3 Encounters:  08/09/19 118 lb 9.6 oz (53.8 kg)  07/09/19 119 lb 6.4 oz (54.2 kg)  06/28/19 124 lb (56.2 kg)     Health Maintenance Due  Topic Date Due  . COVID-19 Vaccine (1) Never done  . HIV Screening  Never done  . TETANUS/TDAP  Never done  . PAP SMEAR-Modifier  Never done    There are no preventive care reminders to display for this patient.  Lab Results  Component Value Date   TSH 0.85 06/04/2019   Lab Results  Component Value Date   WBC 9.9 06/04/2019   HGB 13.5 06/04/2019   HCT 39.8 06/04/2019   MCV 84.0 06/04/2019   PLT 230.0 06/04/2019   Lab Results  Component Value Date   NA 142 07/09/2019   K 3.4 (L) 07/09/2019   CO2 32 07/09/2019   GLUCOSE 128 (H) 07/09/2019   BUN 4 (L) 07/09/2019   CREATININE 0.77 07/09/2019   BILITOT 0.6 07/09/2019   ALKPHOS 80 07/09/2019  AST 21 07/09/2019   ALT 13 07/09/2019   PROT 7.8  07/09/2019   ALBUMIN 4.4 07/09/2019   CALCIUM 9.7 07/09/2019   ANIONGAP 12 05/19/2019   GFR 75.66 07/09/2019   No results found for: CHOL No results found for: HDL No results found for: LDLCALC No results found for: TRIG No results found for: CHOLHDL No results found for: HGBA1C    Assessment & Plan:   Problem List Items Addressed This Visit      Cardiovascular and Mediastinum   Essential hypertension - Primary     Other   Myalgia   Relevant Medications   predniSONE (DELTASONE) 20 MG tablet    Other Visit Diagnoses    Gastroesophageal reflux disease, unspecified whether esophagitis present       Relevant Medications   pantoprazole (PROTONIX) 20 MG tablet   Depression with anxiety       Relevant Medications   mirtazapine (REMERON) 15 MG tablet      Meds ordered this encounter  Medications  . pantoprazole (PROTONIX) 20 MG tablet    Sig: Take 1 tablet (20 mg total) by mouth daily.    Dispense:  30 tablet    Refill:  2  . predniSONE (DELTASONE) 20 MG tablet    Sig: Take 1 tablet (20 mg total) by mouth 2 (two) times daily with a meal for 7 days.    Dispense:  14 tablet    Refill:  0  . mirtazapine (REMERON) 15 MG tablet    Sig: Take 1 tablet (15 mg total) by mouth at bedtime.    Dispense:  30 tablet    Refill:  2    Follow-up: Return in about 2 months (around 10/09/2019), or Take prednisone for 7 days and then use tylenol as needed..   Trial of Protonix for her midepigastric pain with a normal scan.  Trial of prednisone for a week for her myalgias.  We will start her on Remeron that hopefully will help her sleep improve her anxiety and depression. Libby Maw, MD

## 2019-08-23 ENCOUNTER — Other Ambulatory Visit: Payer: Self-pay | Admitting: Family Medicine

## 2019-08-23 DIAGNOSIS — M791 Myalgia, unspecified site: Secondary | ICD-10-CM

## 2019-09-21 ENCOUNTER — Other Ambulatory Visit: Payer: Self-pay | Admitting: Family Medicine

## 2019-09-21 DIAGNOSIS — K219 Gastro-esophageal reflux disease without esophagitis: Secondary | ICD-10-CM

## 2019-10-11 ENCOUNTER — Ambulatory Visit: Payer: Medicaid Other | Admitting: Family Medicine

## 2019-10-14 ENCOUNTER — Encounter: Payer: Self-pay | Admitting: Family Medicine

## 2019-10-14 ENCOUNTER — Telehealth: Payer: Self-pay | Admitting: Family Medicine

## 2019-10-14 NOTE — Telephone Encounter (Signed)
Pt was no show for appt 10/11/19. 1st occurrence. Fee waived. Letter mailed.  PCP,  Please reply back with corresponding letter matching appropriate follow up needs.  A - No follow up necessary B - Follow up urgent - locate patient immediately to schedule appointment. C - Follow up necessary. Contact patient and schedule visit w/in 7 days. D - Follow up necessary. Contact patient and schedule visit w/in 2-4 weeks.  E - Follow up necessary. Contact patient and schedule visit w/in 3 months.

## 2020-03-06 ENCOUNTER — Other Ambulatory Visit: Payer: Self-pay

## 2020-03-06 ENCOUNTER — Encounter (HOSPITAL_COMMUNITY): Payer: Self-pay | Admitting: Emergency Medicine

## 2020-03-06 ENCOUNTER — Emergency Department (HOSPITAL_COMMUNITY): Payer: Self-pay

## 2020-03-06 ENCOUNTER — Inpatient Hospital Stay (HOSPITAL_COMMUNITY)
Admission: EM | Admit: 2020-03-06 | Discharge: 2020-03-13 | DRG: 641 | Disposition: A | Discharge status: Home / Self Care | Payer: Self-pay | Attending: Internal Medicine | Admitting: Internal Medicine

## 2020-03-06 DIAGNOSIS — Z20822 Contact with and (suspected) exposure to covid-19: Secondary | ICD-10-CM | POA: Diagnosis present

## 2020-03-06 DIAGNOSIS — M549 Dorsalgia, unspecified: Secondary | ICD-10-CM | POA: Diagnosis not present

## 2020-03-06 DIAGNOSIS — T502X5A Adverse effect of carbonic-anhydrase inhibitors, benzothiadiazides and other diuretics, initial encounter: Secondary | ICD-10-CM | POA: Diagnosis present

## 2020-03-06 DIAGNOSIS — H538 Other visual disturbances: Secondary | ICD-10-CM | POA: Diagnosis present

## 2020-03-06 DIAGNOSIS — R339 Retention of urine, unspecified: Secondary | ICD-10-CM | POA: Diagnosis present

## 2020-03-06 DIAGNOSIS — R069 Unspecified abnormalities of breathing: Secondary | ICD-10-CM

## 2020-03-06 DIAGNOSIS — G629 Polyneuropathy, unspecified: Secondary | ICD-10-CM | POA: Diagnosis present

## 2020-03-06 DIAGNOSIS — R519 Headache, unspecified: Secondary | ICD-10-CM | POA: Diagnosis present

## 2020-03-06 DIAGNOSIS — E871 Hypo-osmolality and hyponatremia: Principal | ICD-10-CM | POA: Diagnosis present

## 2020-03-06 DIAGNOSIS — Z888 Allergy status to other drugs, medicaments and biological substances status: Secondary | ICD-10-CM

## 2020-03-06 DIAGNOSIS — R631 Polydipsia: Secondary | ICD-10-CM | POA: Diagnosis present

## 2020-03-06 DIAGNOSIS — E876 Hypokalemia: Secondary | ICD-10-CM | POA: Diagnosis present

## 2020-03-06 DIAGNOSIS — I10 Essential (primary) hypertension: Secondary | ICD-10-CM | POA: Diagnosis present

## 2020-03-06 DIAGNOSIS — R200 Anesthesia of skin: Secondary | ICD-10-CM | POA: Diagnosis present

## 2020-03-06 DIAGNOSIS — Z87891 Personal history of nicotine dependence: Secondary | ICD-10-CM

## 2020-03-06 DIAGNOSIS — R000 Tachycardia, unspecified: Secondary | ICD-10-CM | POA: Diagnosis present

## 2020-03-06 DIAGNOSIS — Z79899 Other long term (current) drug therapy: Secondary | ICD-10-CM

## 2020-03-06 DIAGNOSIS — G8929 Other chronic pain: Secondary | ICD-10-CM | POA: Diagnosis present

## 2020-03-06 DIAGNOSIS — R112 Nausea with vomiting, unspecified: Secondary | ICD-10-CM | POA: Diagnosis present

## 2020-03-06 DIAGNOSIS — M791 Myalgia, unspecified site: Secondary | ICD-10-CM | POA: Diagnosis present

## 2020-03-06 DIAGNOSIS — R1110 Vomiting, unspecified: Secondary | ICD-10-CM | POA: Diagnosis present

## 2020-03-06 HISTORY — DX: Hypo-osmolality and hyponatremia: E87.1

## 2020-03-06 LAB — BASIC METABOLIC PANEL
BUN: 5 mg/dL — ABNORMAL LOW (ref 8–23)
BUN: <5 mg/dL — ABNORMAL LOW (ref 8–23)
CO2: 23 mmol/L (ref 22–32)
CO2: 28 mmol/L (ref 22–32)
Calcium: 9 mg/dL (ref 8.9–10.3)
Calcium: 9.2 mg/dL (ref 8.9–10.3)
Chloride: <65 mmol/L — CL (ref 98–111)
Creatinine, Ser: 0.92 mg/dL (ref 0.44–1.00)
Creatinine, Ser: 0.87 mg/dL (ref 0.44–1.00)
GFR, Estimated: >60 mL/min (ref >60)
GFR, Estimated: >60 mL/min (ref >60)
Glucose, Bld: 223 mg/dL — ABNORMAL HIGH (ref 70–99)
Glucose, Bld: 181 mg/dL — ABNORMAL HIGH (ref 70–99)
Potassium: 3 mmol/L — ABNORMAL LOW (ref 3.5–5.1)
Sodium: 108 mmol/L — CL (ref 135–145)

## 2020-03-06 LAB — CBC WITH DIFFERENTIAL/PLATELET
Abs Immature Granulocytes: 0 10*3/uL (ref 0.00–0.07)
Basophils Absolute: 0 10*3/uL (ref 0.0–0.1)
Basophils Relative: 0 %
Eosinophils Absolute: 0.2 10*3/uL (ref 0.0–0.5)
Eosinophils Relative: 2 %
HCT: 30.1 % — ABNORMAL LOW (ref 36.0–46.0)
Hemoglobin: 11.3 g/dL — ABNORMAL LOW (ref 12.0–15.0)
Lymphocytes Relative: 13 %
Lymphs Abs: 1.1 10*3/uL (ref 0.7–4.0)
MCH: 27.6 pg (ref 26.0–34.0)
MCHC: 37.5 g/dL — ABNORMAL HIGH (ref 30.0–36.0)
MCV: 73.6 fL — ABNORMAL LOW (ref 80.0–100.0)
Monocytes Absolute: 0.5 10*3/uL (ref 0.1–1.0)
Monocytes Relative: 6 %
Neutro Abs: 6.5 10*3/uL (ref 1.7–7.7)
Neutrophils Relative %: 79 %
Platelets: 258 10*3/uL (ref 150–400)
RBC: 4.09 MIL/uL (ref 3.87–5.11)
RDW: 12.7 % (ref 11.5–15.5)
WBC: 8.2 10*3/uL (ref 4.0–10.5)
nRBC: 0 % (ref 0.0–0.2)

## 2020-03-06 LAB — URINALYSIS, ROUTINE W REFLEX MICROSCOPIC
Bilirubin Urine: NEGATIVE
Bilirubin Urine: NEGATIVE
Glucose, UA: NEGATIVE mg/dL
Glucose, UA: NEGATIVE mg/dL
Hgb urine dipstick: NEGATIVE
Hgb urine dipstick: NEGATIVE
Ketones, ur: NEGATIVE mg/dL
Ketones, ur: NEGATIVE mg/dL
Leukocytes,Ua: NEGATIVE
Leukocytes,Ua: NEGATIVE
Nitrite: NEGATIVE
Nitrite: NEGATIVE
Protein, ur: NEGATIVE mg/dL
Protein, ur: NEGATIVE mg/dL
Specific Gravity, Urine: 1.002 — ABNORMAL LOW (ref 1.005–1.030)
Specific Gravity, Urine: 1.002 — ABNORMAL LOW (ref 1.005–1.030)
pH: 6 (ref 5.0–8.0)
pH: 6 (ref 5.0–8.0)

## 2020-03-06 LAB — I-STAT VENOUS BLOOD GAS, ED
Acid-Base Excess: 9 mmol/L — ABNORMAL HIGH (ref 0.0–2.0)
Bicarbonate: 31.6 mmol/L — ABNORMAL HIGH (ref 20.0–28.0)
Calcium, Ion: 0.99 mmol/L — ABNORMAL LOW (ref 1.15–1.40)
HCT: 37 % (ref 36.0–46.0)
Hemoglobin: 12.6 g/dL (ref 12.0–15.0)
O2 Saturation: 100 %
Potassium: 2.9 mmol/L — ABNORMAL LOW (ref 3.5–5.1)
Sodium: 106 mmol/L — CL (ref 135–145)
TCO2: 33 mmol/L — ABNORMAL HIGH (ref 22–32)
pCO2, Ven: 36.1 mmHg — ABNORMAL LOW (ref 44.0–60.0)
pH, Ven: 7.55 — ABNORMAL HIGH (ref 7.250–7.430)
pO2, Ven: 170 mmHg — ABNORMAL HIGH (ref 32.0–45.0)

## 2020-03-06 LAB — COMPREHENSIVE METABOLIC PANEL
ALT: 10 U/L (ref 0–44)
AST: 30 U/L (ref 15–41)
Albumin: 3.9 g/dL (ref 3.5–5.0)
Alkaline Phosphatase: 81 U/L (ref 38–126)
Anion gap: 20 — ABNORMAL HIGH (ref 5–15)
BUN: 5 mg/dL — ABNORMAL LOW (ref 8–23)
CO2: 20 mmol/L — ABNORMAL LOW (ref 22–32)
Calcium: 9.1 mg/dL (ref 8.9–10.3)
Chloride: 67 mmol/L — ABNORMAL LOW (ref 98–111)
Creatinine, Ser: 0.86 mg/dL (ref 0.44–1.00)
GFR, Estimated: >60 mL/min (ref >60)
Glucose, Bld: 178 mg/dL — ABNORMAL HIGH (ref 70–99)
Potassium: 2.9 mmol/L — ABNORMAL LOW (ref 3.5–5.1)
Sodium: 107 mmol/L — CL (ref 135–145)
Total Bilirubin: 0.9 mg/dL (ref 0.3–1.2)
Total Protein: 7 g/dL (ref 6.5–8.1)

## 2020-03-06 LAB — CBC
HCT: 26.9 % — ABNORMAL LOW (ref 36.0–46.0)
Hemoglobin: 9.9 g/dL — ABNORMAL LOW (ref 12.0–15.0)
MCH: 27.2 pg (ref 26.0–34.0)
MCHC: 36.8 g/dL — ABNORMAL HIGH (ref 30.0–36.0)
MCV: 73.9 fL — ABNORMAL LOW (ref 80.0–100.0)
Platelets: 237 10*3/uL (ref 150–400)
RBC: 3.64 MIL/uL — ABNORMAL LOW (ref 3.87–5.11)
RDW: 12.6 % (ref 11.5–15.5)
WBC: 7.8 10*3/uL (ref 4.0–10.5)
nRBC: 0.5 % — ABNORMAL HIGH (ref 0.0–0.2)

## 2020-03-06 LAB — TROPONIN I (HIGH SENSITIVITY)
Troponin I (High Sensitivity): 9 ng/L (ref <18)
Troponin I (High Sensitivity): 9 ng/L (ref <18)

## 2020-03-06 LAB — LIPASE, BLOOD: Lipase: 23 U/L (ref 11–51)

## 2020-03-06 LAB — SODIUM: Sodium: 114 mmol/L — CL (ref 135–145)

## 2020-03-06 LAB — CORTISOL: Cortisol, Plasma: 20.1 ug/dL

## 2020-03-06 LAB — HEMOGLOBIN A1C
Hgb A1c MFr Bld: 4.6 % — ABNORMAL LOW (ref 4.8–5.6)
Mean Plasma Glucose: 85.32 mg/dL

## 2020-03-06 LAB — OSMOLALITY: Osmolality: 227 mOsm/kg — CL (ref 275–295)

## 2020-03-06 LAB — OSMOLALITY, URINE: Osmolality, Ur: 82 mOsm/kg — ABNORMAL LOW (ref 300–900)

## 2020-03-06 LAB — TSH: TSH: 0.513 u[IU]/mL (ref 0.350–4.500)

## 2020-03-06 LAB — SARS CORONAVIRUS 2 BY RT PCR (HOSPITAL ORDER, PERFORMED IN ~~LOC~~ HOSPITAL LAB): SARS Coronavirus 2: NEGATIVE

## 2020-03-06 LAB — BRAIN NATRIURETIC PEPTIDE: B Natriuretic Peptide: 23.1 pg/mL (ref 0.0–100.0)

## 2020-03-06 LAB — POC SARS CORONAVIRUS 2 AG -  ED: SARS Coronavirus 2 Ag: NEGATIVE

## 2020-03-06 LAB — SODIUM, URINE, RANDOM: Sodium, Ur: <10 mmol/L

## 2020-03-06 LAB — MAGNESIUM: Magnesium: 1.8 mg/dL (ref 1.7–2.4)

## 2020-03-06 MED ORDER — POTASSIUM CHLORIDE CRYS ER 20 MEQ PO TBCR
20.0000 meq | EXTENDED_RELEASE_TABLET | Freq: Once | ORAL | Status: AC
  Administered 2020-03-06: 20 meq via ORAL
  Filled 2020-03-06: qty 1

## 2020-03-06 MED ORDER — SODIUM CHLORIDE 0.9 % IV SOLN: INTRAVENOUS | Status: DC

## 2020-03-06 MED ORDER — SODIUM CHLORIDE 3 % IV SOLN
INTRAVENOUS | Status: DC
  Filled 2020-03-06: qty 500

## 2020-03-06 MED ORDER — FAMOTIDINE IN NACL 20-0.9 MG/50ML-% IV SOLN
20.0000 mg | Freq: Two times a day (BID) | INTRAVENOUS | Status: DC
  Administered 2020-03-06 – 2020-03-10 (×9): 20 mg via INTRAVENOUS
  Filled 2020-03-06 (×10): qty 50

## 2020-03-06 MED ORDER — HEPARIN SODIUM (PORCINE) 5000 UNIT/ML IJ SOLN
5000.0000 [IU] | Freq: Three times a day (TID) | INTRAMUSCULAR | Status: DC
  Administered 2020-03-06 – 2020-03-13 (×20): 5000 [IU] via SUBCUTANEOUS
  Filled 2020-03-06 (×20): qty 1

## 2020-03-06 MED ORDER — ACETAMINOPHEN 500 MG PO TABS
1000.0000 mg | ORAL_TABLET | Freq: Once | ORAL | Status: AC
  Administered 2020-03-06: 1000 mg via ORAL
  Filled 2020-03-06: qty 2

## 2020-03-06 MED ORDER — POLYETHYLENE GLYCOL 3350 17 G PO PACK: 17.0000 g | PACK | Freq: Every day | ORAL | Status: DC | PRN

## 2020-03-06 MED ORDER — POTASSIUM CHLORIDE 10 MEQ/100ML IV SOLN
10.0000 meq | Freq: Once | INTRAVENOUS | Status: AC
  Administered 2020-03-06: 10 meq via INTRAVENOUS
  Filled 2020-03-06: qty 100

## 2020-03-06 MED ORDER — ONDANSETRON 4 MG PO TBDP
ORAL_TABLET | ORAL | Status: AC
  Administered 2020-03-06: 4 mg via ORAL
  Filled 2020-03-06: qty 1

## 2020-03-06 MED ORDER — DOCUSATE SODIUM 100 MG PO CAPS: 100.0000 mg | ORAL_CAPSULE | Freq: Two times a day (BID) | ORAL | Status: DC | PRN

## 2020-03-06 MED ORDER — SODIUM CHLORIDE 3 % IV SOLN: INTRAVENOUS | Status: DC

## 2020-03-06 MED ORDER — ONDANSETRON 4 MG PO TBDP: 4.0000 mg | ORAL_TABLET | Freq: Once | ORAL | Status: AC

## 2020-03-06 NOTE — ED Notes (Signed)
Patient transported to MRI 

## 2020-03-06 NOTE — Consult Note (Addendum)
[facility]  HISTORY AND PHYSICAL  Dawn [name] is an 65 y.o. female.    Chief Complaint: blurry vision/ headache/ numbness  HPI: Pt is a F with a PMH sig for HTN and possible neuropathy who is now seen in consultation at the request of Dr Roslynn Amble for evaluation and recommendations surrounding hyponatremia.  Pt was brought in to [facility] by her daughter for 1 week of blurry vision and worsening numbness.  She notes that she went to her PCP just after New Year's (02/18/20) and was prescribed 4 new medications which she has with her (I examined bottles)--> HCTZ, losartan, meloxicam, and gabapentin.  She initially tolerated the medications well and then started having the headaches/ blurry vision about 1 week ago.  Dtr notes that she has had numbness in her hands and feet prior to this but since a week ago got worse.  No seizure activity.  No f/c, n/v.  She has allover body aches.    In ED, Na was 106 K 2.9, CL < 65, BUN < 5, Cr 0.87.  CT head negative, CXR negative, MRI brain pending.   Urine osms are 82, urine Na < 10, Serum osms are 227. In this setting we are asked to see.  Pt is resting comfortably and is in no distress.  She is AAO x 3, speaking with her daughter (who is interpreting) in logical and complete sentences.  She has had no seizure activity, either here or at home.  She has not fallen.   PMH: Past Medical History:  Diagnosis Date  . Elevated BP without diagnosis of hypertension   . Hypertension    PSH: Past Surgical History:  Procedure Laterality Date  . CESAREAN SECTION      Past Medical History:  Diagnosis Date  . Elevated BP without diagnosis of hypertension   . Hypertension     Medications: HCTZ 25 mg daily Losartan 100 mg daily meloxicam 7.5 mg prn gabapentin  ALLERGIES:  No Known Allergies  FAM HX: Family History  Problem Relation Age of Onset  . Chorea Father   . Colon cancer Neg Hx   . Esophageal cancer Neg Hx   . Rectal cancer Neg Hx   .  Stomach cancer Neg Hx     Social History:   reports that she has quit smoking. Her smoking use included cigarettes. She has never used smokeless tobacco. She reports that she does not drink alcohol and does not use drugs.  ROS: ROS: all other systems reviewed and are negative except as per HPI  Blood pressure 132/63, pulse 95, temperature 98.6 F (37 C), temperature source Oral, resp. rate 20, height '5\' 1"'  (1.549 m), weight 53.5 kg, SpO2 100 %. PHYSICAL EXAM: Physical Exam  GEN NAD, sitting in bed, NAD HEENT EOMI PERRL wearing mask NECK no JVD PULM clear CV RRR ABD soft EXT no LE edema NEURO AAO x 3 nonfocal SKIN + turgor   Results for orders placed or performed during the hospital encounter of 03/06/20 (from the past 48 hour(s))  Basic metabolic panel     Status: Abnormal   Collection Time: 03/06/20 11:15 AM  Result Value Ref Range   Sodium  135 - 145 mmol/L    QUESTIONABLE RESULTS, RECOMMEND RECOLLECT TO VERIFY    Comment: TATE,K RN @ 1317 03/06/20 LEONARD,A   Potassium  3.5 - 5.1 mmol/L    QUESTIONABLE RESULTS, RECOMMEND RECOLLECT TO VERIFY    Comment: TATE,K RN @ 1317 03/06/20 LEONARD,A  Chloride  98 - 111 mmol/L    QUESTIONABLE RESULTS, RECOMMEND RECOLLECT TO VERIFY    Comment: TATE,K RN @ 5329 03/06/20 LEONARD,A   CO2 23 22 - 32 mmol/L    Comment: QUESTIONABLE RESULTS, RECOMMEND RECOLLECT TO VERIFY TATE,K RN @ 9242 03/06/20 LEONARD,A    Glucose, Bld 223 (H) 70 - 99 mg/dL    Comment: Glucose reference range applies only to samples taken after fasting for at least 8 hours. QUESTIONABLE RESULTS, RECOMMEND RECOLLECT TO VERIFY TATE,K RN @ 6834 03/06/20 LEONARD,A    BUN 5 (L) 8 - 23 mg/dL    Comment: QUESTIONABLE RESULTS, RECOMMEND RECOLLECT TO VERIFY TATE,K RN @ 1962 03/06/20 LEONARD,A    Creatinine, Ser 0.92 0.44 - 1.00 mg/dL    Comment: QUESTIONABLE RESULTS, RECOMMEND RECOLLECT TO VERIFY TATE,K RN @ 2297 03/06/20 LEONARD,A    Calcium 9.0 8.9 - 10.3 mg/dL    Comment:  QUESTIONABLE RESULTS, RECOMMEND RECOLLECT TO VERIFY TATE,K RN @ 9892 03/06/20 LEONARD,A    GFR, Estimated >60 >60 mL/min    Comment: QUESTIONABLE RESULTS, RECOMMEND RECOLLECT TO VERIFY TATE,K RN @ 1194 03/06/20 LEONARD,A (NOTE) Calculated using the CKD-EPI Creatinine Equation (2021) Performed at [facility], [place] [address] [place], [place] [address]   Basic metabolic panel     Status: Abnormal   Collection Time: 03/06/20  1:28 PM  Result Value Ref Range   Sodium 108 (LL) 135 - 145 mmol/L    Comment: CRITICAL RESULT CALLED TO, READ BACK BY AND VERIFIED WITH: TATE,K RN '@1440'  ON [id number] BY FLEMINGS    Potassium 3.0 (L) 3.5 - 5.1 mmol/L   Chloride <65 (LL) 98 - 111 mmol/L    Comment: CRITICAL RESULT CALLED TO, READ BACK BY AND VERIFIED WITH: TATE,K RN '@1440'  ON [id number] BY FLEMINGS    CO2 28 22 - 32 mmol/L   Glucose, Bld 181 (H) 70 - 99 mg/dL    Comment: Glucose reference range applies only to samples taken after fasting for at least 8 hours.   BUN <5 (L) 8 - 23 mg/dL   Creatinine, Ser 0.87 0.44 - 1.00 mg/dL   Calcium 9.2 8.9 - 10.3 mg/dL   GFR, Estimated >60 >60 mL/min    Comment: (NOTE) Calculated using the CKD-EPI Creatinine Equation (2021)    Anion gap NOT CALCULATED 5 - 15    Comment: Performed at [facility] [address], [place], [place] [address]  I-Stat venous blood gas, ED     Status: Abnormal   Collection Time: 03/06/20  4:18 PM  Result Value Ref Range   pH, Ven 7.550 (H) 7.250 - 7.430   pCO2, Ven 36.1 (L) 44.0 - 60.0 mmHg   pO2, Ven 170.0 (H) 32.0 - 45.0 mmHg   Bicarbonate 31.6 (H) 20.0 - 28.0 mmol/L   TCO2 33 (H) 22 - 32 mmol/L   O2 Saturation 100.0 %   Acid-Base Excess 9.0 (H) 0.0 - 2.0 mmol/L   Sodium 106 (LL) 135 - 145 mmol/L   Potassium 2.9 (L) 3.5 - 5.1 mmol/L   Calcium, Ion 0.99 (L) 1.15 - 1.40 mmol/L   HCT 37.0 36.0 - 46.0 %   Hemoglobin 12.6 12.0 - 15.0 g/dL   Sample type VENOUS    Comment NOTIFIED PHYSICIAN   Urinalysis, Routine w  reflex microscopic Urine, Clean Catch     Status: Abnormal   Collection Time: 03/06/20  5:20 PM  Result Value Ref Range   Color, Urine STRAW (A) YELLOW  APPearance CLEAR CLEAR   Specific Gravity, Urine 1.002 (L) 1.005 - 1.030   pH 6.0 5.0 - 8.0   Glucose, UA NEGATIVE NEGATIVE mg/dL   Hgb urine dipstick NEGATIVE NEGATIVE   Bilirubin Urine NEGATIVE NEGATIVE   Ketones, ur NEGATIVE NEGATIVE mg/dL   Protein, ur NEGATIVE NEGATIVE mg/dL   Nitrite NEGATIVE NEGATIVE   Leukocytes,Ua NEGATIVE NEGATIVE    Comment: Performed at [facility] [address], [place], [place] [address]  Osmolality     Status: Abnormal   Collection Time: 03/06/20  5:20 PM  Result Value Ref Range   Osmolality 227 (LL) 275 - 295 mOsm/kg    Comment: REPEATED TO VERIFY CRITICAL RESULT CALLED TO, READ BACK BY AND VERIFIED WITH: E.MEEKS,RN '@1811'  03/06/2020 [email or web address].J Performed at [facility], [place] [address], [place], [place] [address]   Osmolality, urine     Status: Abnormal   Collection Time: 03/06/20  5:21 PM  Result Value Ref Range   Osmolality, Ur 82 (L) 300 - 900 mOsm/kg    Comment: REPEATED TO VERIFY Performed at [facility], [place] [address] [place], [place] [address]   Sodium, urine, random     Status: None   Collection Time: 03/06/20  5:27 PM  Result Value Ref Range   Sodium, Ur <10 mmol/L    Comment: Performed at [facility] [address], [place], [place] [address]  POC SARS Coronavirus 2 Ag-ED - Nasal Swab (BD Veritor Kit)     Status: None   Collection Time: 03/06/20  6:01 PM  Result Value Ref Range   SARS Coronavirus 2 Ag NEGATIVE NEGATIVE    Comment: (NOTE) SARS-CoV-2 antigen PRESENT.  Positive results indicate the presence of viral antigens, but clinical correlation with patient history and other diagnostic information is necessary to determine patient infection status.  Positive results do not rule out bacterial infection or co-infection  with other viruses. False  positive results are rare but can occur, and confirmatory RT-PCR testing may be appropriate in some circumstances. The expected result is Negative.  Fact Sheet for Patients: HandmadeRecipes.com.cy  Fact Sheet for Healthcare Providers: FuneralLife.at  This test is not yet approved or cleared by the [place] FDA and  has been authorized for detection and/or diagnosis of SARS-CoV-2 by FDA under an Emergency Use Authorization (EUA).  This EUA will remain in effect (meaning this test can be used) for the duration of  the COVID-19 declaration under Section 564(b)(1) of the Act, 21 U.S.C. section [phone]( b)(1), unless the authorization is terminated or revoked sooner.      CT HEAD WO CONTRAST  Result Date: 03/06/2020 CLINICAL DATA:  Headache and dizziness EXAM: CT HEAD WITHOUT CONTRAST TECHNIQUE: Contiguous axial images were obtained from the base of the skull through the vertex without intravenous contrast. COMPARISON:  Head CT August 29, 2009 FINDINGS: Brain: No evidence of acute infarction, hemorrhage, hydrocephalus, extra-axial collection or mass lesion/mass effect. Mild (5 mm) of cerebellar tonsillar ectopy, similar prior. Vascular: No hyperdense vessel or unexpected calcification. 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 Sinuses/Orbits: No acute finding. Other: None. IMPRESSION: 1. No acute intracranial findings. Electronically Signed   By: Dahlia Bailiff MD   On: 03/06/2020 12:27   DG Chest Portable 1 View  Result Date: 03/06/2020 CLINICAL DATA:  Chest pain EXAM: PORTABLE CHEST  1 VIEW COMPARISON:  05/19/2019 FINDINGS: The heart size and mediastinal contours are within normal limits. Both lungs are clear. The visualized skeletal structures are unremarkable. IMPRESSION: No active disease.  Electronically Signed   By: Inez Catalina M.D.   On: 03/06/2020 16:42    Assessment/Plan  1.  Hypotonic hyponatremia: Urine osms are 82, urine Na < 10, Serum osms are 227 .  This is nearly maximally dilute urine with low urine Na and low serum osms.  This appears to be low solute intake exacerbated by the recent addition (02/18/20) of HCTZ.  Initially I thought that 3% would be required- but combining the excessively low solute + hypokalemia--> I am concerned that giving 3% would overcorrect her no matter how conservative we would be.  At this level of hypotonicity, any addition of solute, including potassium, will help correct the problem (this happens by restoration of countercurrent multiplier in Loop of Henle, allowing proper dilution/ concentration of urine to occur).  So- plan as follows: - We will give IV and PO potassium  - start on NS @ 100 mL/ hr.   - Will need q 2 hr Na checks and neuro checks.    - expect serum Na to improve with above measures, if not or has observed worsening of neuro status will do 3% - hold HCTZ and losartan - hold meloxicam  2.  Hypokalemia: - likely d/t HCTZ - replete as abvoe  3.  HTN: - reasonably well controlled at present - if agent is needed, would start amlodipine  Dawn [name] 03/06/2020, 6:14 PM

## 2020-03-06 NOTE — H&P (Signed)
NAMELillianne [name] MRN:  [id number] DOB:  [birth year] LOS: 0 ADMISSION DATE:  03/06/2020 DATE OF SERVICE:  03/06/2020  CHIEF COMPLAINT:  Numbness, tingling, blurred vision   HISTORY & PHYSICAL  History of Present Illness  This 65 y.o. Asian [place]) female reformed smoker presented to the [facility] Emergency Department today with complaints of numbness and tingling and blurred vision.  She also had a bout of emesis upon arrival in the ER. According to the patient's daughter, the patient was started on new medications on 02/18/2020 (losartan, HCTZ, gabapentin, meloxicam) and subsequently developed blurry vision and worsening numbness.  The reports that she takes 2 medications (presumably meloxicam and gabapentin for pain but states that her diffuse numbness is new (onset 1 week ago).  Initial ER evaluation reveals Na 107.  Head CT shows no acute intracranial process.  No evidence of cerebral edema.  No seizure activity.  No loss of consciousness.  The patient has been seen by the Nephrology service, who has recommended initiating treatment with 3% saline, warranting monitoring in an ICU setting.   REVIEW OF SYSTEMS Constitutional: No weight loss. No night sweats. No fever. No chills. No fatigue. HEENT: No headaches, dysphagia, sore throat, otalgia, nasal congestion, PND CV:  No chest pain, orthopnea, PND, swelling in lower extremities, palpitations GI:  Constipation (chronic per the patient).  Vomiting.  Denies nausea.  No abdominal pain, diarrhea, change in bowel pattern. Resp: No DOE, rest dyspnea, cough, mucus, hemoptysis, wheezing  GU: Urgency/hesitancy.  No dysuria, change in color of urine..  No flank pain. MS:  No joint pain or swelling. No myalgias,  No decreased range of motion.  Psych:  No change in mood or affect. No memory loss. Skin: no rash or lesions. Neuro: dizziness, generalized numbness.   Past Medical/Surgical/Social/Family History   Past Medical  History:  Diagnosis Date  . Elevated BP without diagnosis of hypertension   . Hypertension     Past Surgical History:  Procedure Laterality Date  . CESAREAN SECTION      Social History   Tobacco Use  . Smoking status: Former Smoker    Types: Cigarettes  . Smokeless tobacco: Never Used  . Tobacco comment: Quit 2 months ago per pt  Substance Use Topics  . Alcohol use: Never    Family History  Problem Relation Age of Onset  . Chorea Father   . Colon cancer Neg Hx   . Esophageal cancer Neg Hx   . Rectal cancer Neg Hx   . Stomach cancer Neg Hx      Procedures:  N/A   Significant Diagnostic Tests:  CT head shows no acute intracranial process.  CXR shows no acute cardiopulmonary process.   Micro Data:   Results for orders placed or performed during the hospital encounter of 03/06/20  SARS Coronavirus 2 by RT PCR (hospital order, performed in [facility] hospital lab) Nasopharyngeal Nasopharyngeal Swab     Status: None   Collection Time: 03/06/20  7:05 PM   Specimen: Nasopharyngeal Swab  Result Value Ref Range Status   SARS Coronavirus 2 NEGATIVE NEGATIVE Final    Comment: (NOTE) SARS-CoV-2 target nucleic acids are NOT DETECTED.  The SARS-CoV-2 RNA is generally detectable in upper and lower respiratory specimens during the acute phase of infection. The lowest concentration of SARS-CoV-2 viral copies this assay can detect is 250 copies / mL. A negative result does not preclude SARS-CoV-2 infection and should not be used as the sole  basis for treatment or other patient management decisions.  A negative result may occur with improper specimen collection / handling, submission of specimen other than nasopharyngeal swab, presence of viral mutation(s) within the areas targeted by this assay, and inadequate number of viral copies (<250 copies / mL). A negative result must be combined with clinical observations, patient history, and epidemiological information.  Fact  Sheet for Patients:   StrictlyIdeas.no  Fact Sheet for Healthcare Providers: BankingDealers.co.za  This test is not yet approved or  cleared by the [place] FDA and has been authorized for detection and/or diagnosis of SARS-CoV-2 by FDA under an Emergency Use Authorization (EUA).  This EUA will remain in effect (meaning this test can be used) for the duration of the COVID-19 declaration under Section 564(b)(1) of the Act, 21 U.S.C. section 360bbb-3(b)(1), unless the authorization is terminated or revoked sooner.  Performed at [facility], [place] [address] [place], [place] [address]      Antimicrobials:  N/A    Interim history/subjective:  N/A   Objective   BP 134/74   Pulse 89   Temp 98.6 F (37 C) (Oral)   Resp 16   Ht 5\' 1"  (1.549 m)   Wt 53.5 kg   SpO2 99%   BMI 22.30 kg/m     Filed Weights   03/06/20 1116 03/06/20 1124  Weight: 53.5 kg 53.5 kg   No intake or output data in the 24 hours ending 03/06/20 2035      Examination: GENERAL: alert, oriented to time, person and place, pleasant or well-developed. No acute distress. HEAD: normocephalic, atraumatic EYE: PERRLA, EOM intact, no scleral icterus, no pallor. NOSE: nares are patent. No polyps. No exudate. No sinus tenderness. THROAT/ORAL CAVITY: Normal dentition. No oral thrush. No exudate. Mucous membranes are moist. No tonsillar enlargement.  NECK: supple, no thyromegaly, no JVD, no lymphadenopathy. Trachea midline. CHEST/LUNG: symmetric in development and expansion. Good air entry. No crackles. No wheezes. HEART: Regular S1 and S2 without murmur, rub or gallop. ABDOMEN: soft, nontender, nondistended. Normoactive bowel sounds. No rebound. No guarding. No hepatosplenomegaly. EXTREMITIES: Edema: none. No cyanosis. No clubbing. 2+ DP pulses LYMPHATIC: no cervical/axillary/inguinal lymph nodes appreciated MUSCULOSKELETAL: No point tenderness. No bulk  atrophy. Joints: normal..  SKIN:  No rash or lesion. NEUROLOGIC: Doll's eyes intact. Corneal reflex intact. Spontaneous respirations intact. Cranial nerves II-XII are grossly symmetric and physiologic. Babinski absent. No sensory deficit. Motor: 5/5 @ RUE, 5/5 @ LUE, 5/5 @ RLL,  5/5 @ LLL.  DTR: 2+ @ R biceps, 2+ @ L biceps, 2+ @ R patellar,  2+ @ L patellar. No cerebellar signs. Gait was not assessed.  Generalized numbness.   Resolved Hospital Problem list   N/A   Assessment & Plan:   ASSESSMENT/PLAN:  ASSESSMENT (included in the Hospital Problem List)  Principal Problem:   Acute hyponatremia Active Problems:   Numbness   Emesis   By systems: PULMONARY  No acute issues   CARDIOVASCULAR  Hypertension Hold HCTZ for now. Continue losartan.   RENAL  Urinary retention Insert Foley catheter (urinary retnetion, strict I/O)   GASTROINTESTINAL  Emesis, likely due to hyponatremia  GI PROPHYLAXIS: famotidine   HEMATOLOGIC  No acute issues  DVT PROPHYLAXIS: heparin   INFECTIOUS  No acute issues   ENDOCRINE  Polydipsia Check HbA1c.   NEUROLOGIC  Neuropathy, acute vs acute on chronic vs chronic Hold gabapentin   PLAN/RECOMMENDATIONS   Admit to ICU under my service (Attending: Renee Pain, MD) with  the diagnoses highlighted above in the active Hospital Problem List (ASSESSMENT).  IV fluids: start 3% saline @ 50 mL/hr.2  Meds: hold HCTZ as this could have triggered/exacerbated hyponatremia  Labs: monitor serial Na  Imaging: MRI brain pending  NUTRITION: NPO for now    My assessment, plan of care, findings, medications, side effects, etc. were discussed with: nurse.   Best practice:  Diet: NPO for now Pain/Anxiety/Delirium protocol (if indicated): N/A VAP protocol (if indicated): N/A DVT prophylaxis: heparin GI prophylaxis: famotidine Glucose control: N/A Mobility/Activity: bedrest for now   Code Status: Full Code Family Communication:  no  family at bedside. Disposition:    Labs   CBC: Recent Labs  Lab 03/06/20 1618 03/06/20 1720  WBC  --  8.2  NEUTROABS  --  6.5  HGB 12.6 11.3*  HCT 37.0 30.1*  MCV  --  73.6*  PLT  --  0000000    Basic Metabolic Panel: Recent Labs  Lab 03/06/20 1115 03/06/20 1328 03/06/20 1618 03/06/20 1721  NA QUESTIONABLE RESULTS, RECOMMEND RECOLLECT TO VERIFY 108* 106* 107*  K QUESTIONABLE RESULTS, RECOMMEND RECOLLECT TO VERIFY 3.0* 2.9* 2.9*  CL QUESTIONABLE RESULTS, RECOMMEND RECOLLECT TO VERIFY <65*  --  67*  CO2 23 28  --  20*  GLUCOSE 223* 181*  --  178*  BUN 5* <5*  --  5*  CREATININE 0.92 0.87  --  0.86  CALCIUM 9.0 9.2  --  9.1   GFR: Estimated Creatinine Clearance: 50.5 mL/min (by C-G formula based on SCr of 0.86 mg/dL). Recent Labs  Lab 03/06/20 1720  WBC 8.2    Liver Function Tests: Recent Labs  Lab 03/06/20 1721  AST 30  ALT 10  ALKPHOS 81  BILITOT 0.9  PROT 7.0  ALBUMIN 3.9   Recent Labs  Lab 03/06/20 1720  LIPASE 23   No results for input(s): AMMONIA in the last 168 hours.  ABG    Component Value Date/Time   HCO3 31.6 (H) 03/06/2020 1618   TCO2 33 (H) 03/06/2020 1618   O2SAT 100.0 03/06/2020 1618     Coagulation Profile: No results for input(s): INR, PROTIME in the last 168 hours.  Cardiac Enzymes: No results for input(s): CKTOTAL, CKMB, CKMBINDEX, TROPONINI in the last 168 hours.  HbA1C: No results found for: HGBA1C  CBG: No results for input(s): GLUCAP in the last 168 hours.   Past Medical History   Past Medical History:  Diagnosis Date  . Elevated BP without diagnosis of hypertension   . Hypertension       Surgical History    Past Surgical History:  Procedure Laterality Date  . CESAREAN SECTION        Social History   Social History   Socioeconomic History  . Marital status: Married    Spouse name: Not on file  . Number of children: 3  . Years of education: Not on file  . Highest education level: Not on file   Occupational History  . Not on file  Tobacco Use  . Smoking status: Former Smoker    Types: Cigarettes  . Smokeless tobacco: Never Used  . Tobacco comment: Quit 2 months ago per pt  Vaping Use  . Vaping Use: Never used  Substance and Sexual Activity  . Alcohol use: Never  . Drug use: Never  . Sexual activity: Not on file  Other Topics Concern  . Not on file  Social History Narrative  . Not on file   Social Determinants of  Health   Financial Resource Strain: Not on file  Food Insecurity: Not on file  Transportation Needs: Not on file  Physical Activity: Not on file  Stress: Not on file  Social Connections: Not on file      Family History    Family History  Problem Relation Age of Onset  . Chorea Father   . Colon cancer Neg Hx   . Esophageal cancer Neg Hx   . Rectal cancer Neg Hx   . Stomach cancer Neg Hx    family history includes Chorea in her father. There is no history of Colon cancer, Esophageal cancer, Rectal cancer, or Stomach cancer.    Allergies No Known Allergies    Current Medications  Current Facility-Administered Medications:  .  0.9 %  sodium chloride infusion, , Intravenous, Continuous, Madelon Lips, MD, Last Rate: 100 mL/hr at 03/06/20 1916, New Bag at 03/06/20 1916  Current Outpatient Medications:  .  acetaminophen (TYLENOL) 500 MG tablet, Take 500 mg by mouth every 6 (six) hours as needed., Disp: , Rfl:  .  ibuprofen (ADVIL) 200 MG tablet, Take 200 mg by mouth every 6 (six) hours as needed., Disp: , Rfl:  .  losartan (COZAAR) 100 MG tablet, Take 1 tablet (100 mg total) by mouth daily., Disp: 90 tablet, Rfl: 3 .  mirtazapine (REMERON) 15 MG tablet, Take 1 tablet (15 mg total) by mouth at bedtime., Disp: 30 tablet, Rfl: 2 .  pantoprazole (PROTONIX) 20 MG tablet, TAKE 1 TABLET(20 MG) BY MOUTH DAILY, Disp: 90 tablet, Rfl: 0 .  predniSONE (DELTASONE) 20 MG tablet, TAKE 1 TABLET(20 MG) BY MOUTH TWICE DAILY WITH A MEAL FOR 7 DAYS, Disp: 14  tablet, Rfl: 0   Home Medications  Prior to Admission medications   Medication Sig Start Date End Date Taking? Authorizing Provider  acetaminophen (TYLENOL) 500 MG tablet Take 500 mg by mouth every 6 (six) hours as needed.    [provider]  ibuprofen (ADVIL) 200 MG tablet Take 200 mg by mouth every 6 (six) hours as needed.    [provider]  losartan (COZAAR) 100 MG tablet Take 1 tablet (100 mg total) by mouth daily. 07/09/19   Libby Maw, MD  mirtazapine (REMERON) 15 MG tablet Take 1 tablet (15 mg total) by mouth at bedtime. 08/09/19   Libby Maw, MD  pantoprazole (PROTONIX) 20 MG tablet TAKE 1 TABLET(20 MG) BY MOUTH DAILY 09/21/19   Libby Maw, MD  predniSONE (DELTASONE) 20 MG tablet TAKE 1 TABLET(20 MG) BY MOUTH TWICE DAILY WITH A MEAL FOR 7 DAYS 08/24/19   Libby Maw, MD      Critical care time: 45 minutes.  The treatment and management of the patient's condition was required based on the threat of imminent deterioration. This time reflects time spent by the physician evaluating, providing care and managing the critically ill patient's care. The time was spent at the immediate bedside (or on the same floor/unit and dedicated to this patient's care). Time involved in separately billable procedures is NOT included int he critical care time indicated above. Family meeting and update time may be included above if and only if the patient is unable/incompetent to participate in clinical interview and/or decision making, and the discussion was necessary to determining treatment decisions.   Renee Pain, MD Board Certified by the ABIM, [place] Pager: [phone]

## 2020-03-06 NOTE — ED Notes (Signed)
Critical labs reports to [place], [place]

## 2020-03-06 NOTE — ED Triage Notes (Signed)
Pt reports 1 week of numbness and tingling in both hands along with blurred vision. She vomited 1 x this morning on arrival here. Hx of vertigo.

## 2020-03-06 NOTE — Progress Notes (Signed)
Discussed with Drs Carson Myrtle and Roslynn Amble Will check serum Na before proceeding with further 3% as she has had NS for about 2/12 hrs now.    If not increasing appropriately will proceed with 3%.    Target Na in 24 hrs is 112-113.  Madelon Lips MD [employer]

## 2020-03-06 NOTE — ED Provider Notes (Signed)
[facility] Provider Note   CSN: [id number] Arrival date & time: 03/06/20  1108     History Chief Complaint  Patient presents with  . Blurred Vision  . Numbness    Dawn [name] is a 65 y.o. female possible history of hypertension brought in for evaluation of generalized body aches, numbness/tingling to bilateral fingertips, bilateral toes, blurred vision, decreased appetite, vomiting that is been ongoing for about a week.  Patient states that she "aches all over and that everything hurts."  She states she will get numbness in her fingertips and her toes bilaterally.  She has no difficulty moving them and has been able to ambulate.  She also reports that over the last week, she has gotten intermittent blurry vision in both eyes.  No vision loss.  She also states that she has had some mid central chest pain that has been ongoing for the last 2 days.  She cannot recall if there is anything that makes it better or worse.  She also has been complaining of some abdominal pain.  She states she had one episode of vomiting today.  She has not otherwise been vomiting.  She states she feels lightheaded or dizzy.  She reports she got 1 Covid vaccine.  She does tell me that there have been some family members that have been sick and tested positive for Covid.  She does not currently smoke.  She states she has missed her high blood pressure medication as she has been out of it and has not gotten refills. Patient denies any SOB, cough.   Additional history provided by daughter who states that patient has not felt good for about a week or so.  She states that they called her doctor and given that she was having tingling and blurry vision, they recommend her to go to the emergency department for further evaluation.  Patient is on 25mg  HCTZ but states she hasn't taken it in a week.   The history is provided by the patient and a relative. A language interpreter was used.        Past Medical History:  Diagnosis Date  . Elevated BP without diagnosis of hypertension   . Hypertension     Patient Active Problem List   Diagnosis Date Noted  . Acute hyponatremia 03/06/2020  . Epigastric pain 07/09/2019  . Myalgia 06/04/2019  . Essential hypertension 06/04/2019  . Healthcare maintenance 06/04/2019  . Hypokalemia 06/04/2019  . CHF (congestive heart failure) ([place]) 07/22/2016  . Chest pain 07/22/2016    Past Surgical History:  Procedure Laterality Date  . CESAREAN SECTION       OB History   No obstetric history on file.     Family History  Problem Relation Age of Onset  . Chorea Father   . Colon cancer Neg Hx   . Esophageal cancer Neg Hx   . Rectal cancer Neg Hx   . Stomach cancer Neg Hx     Social History   Tobacco Use  . Smoking status: Former Smoker    Types: Cigarettes  . Smokeless tobacco: Never Used  . Tobacco comment: Quit 2 months ago per pt  Vaping Use  . Vaping Use: Never used  Substance Use Topics  . Alcohol use: Never  . Drug use: Never    Home Medications Prior to Admission medications   Medication Sig Start Date End Date Taking? Authorizing Provider  acetaminophen (TYLENOL) 500 MG tablet Take 500 mg by mouth every  6 (six) hours as needed.    [provider]  ibuprofen (ADVIL) 200 MG tablet Take 200 mg by mouth every 6 (six) hours as needed.    [provider]  losartan (COZAAR) 100 MG tablet Take 1 tablet (100 mg total) by mouth daily. 07/09/19   Libby Maw, MD  mirtazapine (REMERON) 15 MG tablet Take 1 tablet (15 mg total) by mouth at bedtime. 08/09/19   Libby Maw, MD  pantoprazole (PROTONIX) 20 MG tablet TAKE 1 TABLET(20 MG) BY MOUTH DAILY 09/21/19   Libby Maw, MD  predniSONE (DELTASONE) 20 MG tablet TAKE 1 TABLET(20 MG) BY MOUTH TWICE DAILY WITH A MEAL FOR 7 DAYS 08/24/19   Libby Maw, MD    Allergies    Patient has no known allergies.  Review of  Systems   Review of Systems  Constitutional: Positive for appetite change and fatigue. Negative for fever.  Eyes: Positive for visual disturbance.  Respiratory: Negative for cough and shortness of breath.   Cardiovascular: Positive for chest pain.  Gastrointestinal: Positive for abdominal pain, nausea and vomiting.  Genitourinary: Negative for dysuria and hematuria.  Neurological: Positive for light-headedness and numbness. Negative for weakness and headaches.  All other systems reviewed and are negative.   Physical Exam Updated Vital Signs BP 131/66   Pulse 90   Temp 98.6 F (37 C) (Oral)   Resp (!) 22   Ht 5\' 1"  (1.549 m)   Wt 53.5 kg   SpO2 99%   BMI 22.30 kg/m   Physical Exam Vitals and nursing note reviewed.  Constitutional:      Appearance: Normal appearance. She is well-developed and well-nourished.  HENT:     Head: Normocephalic and atraumatic.     Mouth/Throat:     Mouth: Oropharynx is clear and moist. Mucous membranes are dry.  Eyes:     General: Lids are normal.     Extraocular Movements: EOM normal.     Conjunctiva/sclera: Conjunctivae normal.     Pupils: Pupils are equal, round, and reactive to light.     Comments: PERRL. EOMs intact. No nystagmus. No neglect.   Cardiovascular:     Rate and Rhythm: Normal rate and regular rhythm.     Pulses: Normal pulses.     Heart sounds: Normal heart sounds. No murmur heard. No friction rub. No gallop.   Pulmonary:     Effort: Pulmonary effort is normal.     Breath sounds: Normal breath sounds.     Comments: Lungs clear to auscultation bilaterally.  Symmetric chest rise.  No wheezing, rales, rhonchi. Able to speak in full sentences without any difficulty. No evidence of respiratory distress.  Chest:     Comments: Mild tenderness to palpation noted to anterior chest wall.  Abdominal:     Palpations: Abdomen is soft. Abdomen is not rigid.     Tenderness: There is generalized abdominal tenderness. There is no guarding.      Comments: Abdomen is soft, non-distended. Generalized tenderness with no focal point. No rigidity, No guarding. No peritoneal signs.  Musculoskeletal:        General: Normal range of motion.     Cervical back: Full passive range of motion without pain.  Skin:    General: Skin is warm and dry.     Capillary Refill: Capillary refill takes less than 2 seconds.  Neurological:     Mental Status: She is alert and oriented to person, place, and time.  Comments: Cranial nerves III-XII intact Follows commands, Moves all extremities  5/5 strength to BUE and BLE  Sensation intact throughout all major nerve distributions No slurred speech. No facial droop.   Psychiatric:        Mood and Affect: Mood and affect normal.        Speech: Speech normal.     ED Results / Procedures / Treatments   Labs (all labs ordered are listed, but only abnormal results are displayed) Labs Reviewed  BASIC METABOLIC PANEL - Abnormal; Notable for the following components:      Result Value   Glucose, Bld 223 (*)    BUN 5 (*)    All other components within normal limits  URINALYSIS, ROUTINE W REFLEX MICROSCOPIC - Abnormal; Notable for the following components:   Color, Urine STRAW (*)    Specific Gravity, Urine 1.002 (*)    All other components within normal limits  BASIC METABOLIC PANEL - Abnormal; Notable for the following components:   Sodium 108 (*)    Potassium 3.0 (*)    Chloride <65 (*)    Glucose, Bld 181 (*)    BUN <5 (*)    All other components within normal limits  CBC WITH DIFFERENTIAL/PLATELET - Abnormal; Notable for the following components:   Hemoglobin 11.3 (*)    HCT 30.1 (*)    MCV 73.6 (*)    MCHC 37.5 (*)    All other components within normal limits  COMPREHENSIVE METABOLIC PANEL - Abnormal; Notable for the following components:   Sodium 107 (*)    Potassium 2.9 (*)    Chloride 67 (*)    CO2 20 (*)    Glucose, Bld 178 (*)    BUN 5 (*)    Anion gap 20 (*)    All other  components within normal limits  OSMOLALITY, URINE - Abnormal; Notable for the following components:   Osmolality, Ur 82 (*)    All other components within normal limits  OSMOLALITY - Abnormal; Notable for the following components:   Osmolality 227 (*)    All other components within normal limits  I-STAT VENOUS BLOOD GAS, ED - Abnormal; Notable for the following components:   pH, Ven 7.550 (*)    pCO2, Ven 36.1 (*)    pO2, Ven 170.0 (*)    Bicarbonate 31.6 (*)    TCO2 33 (*)    Acid-Base Excess 9.0 (*)    Sodium 106 (*)    Potassium 2.9 (*)    Calcium, Ion 0.99 (*)    All other components within normal limits  SARS CORONAVIRUS 2 BY RT PCR (HOSPITAL ORDER, [facility])  LIPASE, BLOOD  TSH  SODIUM, URINE, RANDOM  CORTISOL  HIV ANTIBODY (ROUTINE TESTING W REFLEX)  CBC  BRAIN NATRIURETIC PEPTIDE  BASIC METABOLIC PANEL  BASIC METABOLIC PANEL  BASIC METABOLIC PANEL  BASIC METABOLIC PANEL  MAGNESIUM  URINALYSIS, ROUTINE W REFLEX MICROSCOPIC  MAGNESIUM  PHOSPHORUS  I-STAT CHEM 8, ED  POC SARS CORONAVIRUS 2 AG -  ED  TROPONIN I (HIGH SENSITIVITY)  TROPONIN I (HIGH SENSITIVITY)    EKG EKG Interpretation  Date/Time:  Monday March 06 2020 11:15:01 EST Ventricular Rate:  105 PR Interval:  132 QRS Duration: 88 QT Interval:  400 QTC Calculation: 528 R Axis:   40 Text Interpretation: Sinus tachycardia Otherwise normal ECG Confirmed by Madalyn Rob [id number]) on 03/06/2020 4:29:04 PM   Radiology CT HEAD WO CONTRAST  Result Date: 03/06/2020  CLINICAL DATA:  Headache and dizziness EXAM: CT HEAD WITHOUT CONTRAST TECHNIQUE: Contiguous axial images were obtained from the base of the skull through the vertex without intravenous contrast. COMPARISON:  Head CT August 29, 2009 FINDINGS: Brain: No evidence of acute infarction, hemorrhage, hydrocephalus, extra-axial collection or mass lesion/mass effect. Mild (5 mm) of cerebellar tonsillar ectopy, similar prior.  Vascular: No hyperdense vessel or unexpected calcification. 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 Sinuses/Orbits: No acute finding. Other: None. IMPRESSION: 1. No acute intracranial findings. Electronically Signed   By: Dahlia Bailiff MD   On: 03/06/2020 12:27   DG Chest Portable 1 View  Result Date: 03/06/2020 CLINICAL DATA:  Chest pain EXAM: PORTABLE CHEST 1 VIEW COMPARISON:  05/19/2019 FINDINGS: The heart size and mediastinal contours are within normal limits. Both lungs are clear. The visualized skeletal structures are unremarkable. IMPRESSION: No active disease. Electronically Signed   By: Inez Catalina M.D.   On: 03/06/2020 16:42    Procedures .Critical Care Performed by: Volanda Napoleon, PA-C Authorized by: Volanda Napoleon, PA-C   Critical care provider statement:    Critical care time (minutes):  35   Critical care was necessary to treat or prevent imminent or life-threatening deterioration of the following conditions:  Metabolic crisis   Critical care was time spent personally by me on the following activities:  Discussions with consultants, evaluation of patient's response to treatment, examination of patient, ordering and performing treatments and interventions, ordering and review of laboratory studies, ordering and review of radiographic studies, pulse oximetry, re-evaluation of patient's condition, obtaining history from patient or surrogate and review of old charts    Medications Ordered in ED Medications  0.9 %  sodium chloride infusion ( Intravenous New Bag/Given 03/06/20 1916)  docusate sodium (COLACE) capsule 100 mg (has no administration in time range)  polyethylene glycol (MIRALAX / GLYCOLAX) packet 17 g (has no administration in time range)  heparin injection 5,000 Units (has no administration in time range)  famotidine  (PEPCID) IVPB 20 mg premix (has no administration in time range)  ondansetron (ZOFRAN-ODT) disintegrating tablet 4 mg (4 mg Oral Given 03/06/20 1152)  acetaminophen (TYLENOL) tablet 1,000 mg (1,000 mg Oral Given 03/06/20 1715)  potassium chloride 10 mEq in 100 mL IVPB (10 mEq Intravenous New Bag/Given 03/06/20 1919)  potassium chloride SA (KLOR-CON) CR tablet 20 mEq (20 mEq Oral Given 03/06/20 1919)       ED Course  I have reviewed the triage vital signs and the nursing notes.  Pertinent labs & imaging results that were available during my care of the patient were reviewed by me and considered in my medical decision making (see chart for details).    MDM Rules/Calculators/A&P                          65 year old female with past x-ray of hypertension who presents for evaluation of 1 week of body aches, nausea, dizziness/lightheadedness, tingling to bilateral fingers, toes, intermittent blurry vision.  Also endorses some chest pain, abdominal pain.  She states that members of her family have been sick and some tested positive for Covid.  She has gotten 1 vaccine.  On initial arrival, she is afebrile, nontoxic-appearing.  Vital signs are stable.  She seems to be alert and is able to answer my questions appropriately.  Do not see any signs of tremor, confusion.  She has some generalized tenderness on abdominal exam.  Lungs clear to auscultation.  No obvious neuro deficits.  Labs ordered at triage.  BMP shows sodium 108, potassium of 3.0.  BUN and creatinine are within normal limits. Lipase is normal. Chest x-ray is normal.  CT head is normal.  We will plan to repeat labs to ensure that this is the correct sodium.  CBC shows no leukocytosis. Hgb is 11.3. Additionally, will obtain MRI given that she is having some tingling sensation.  Her i-STAT Chem-8 shows a potassium of 2.9, sodium of 105.  Discussed patient with Dr. Hollie Salk (Nephrology) who recommends starting patient on 30 cc/hr of hypertonic  saline. No bolus. Patient will need q2hr Na checks. She recommends obtaining serum osmol, urine osmol, urine sodium. Also recommends obtaining a TSH and cortisol level. She will plan to consult.   Discussed with critical care team who will come see the patient.   Nephrology has adjusted patient to normal saline instead of hypertonic. Critical care made aware.   Critical Care will admit the patient.   Dawn [name] was evaluated in Emergency Department on 03/06/2020 for the symptoms described in the history of present illness. She was evaluated in the context of the global COVID-19 pandemic, which necessitated consideration that the patient might be at risk for infection with the SARS-CoV-2 virus that causes COVID-19. Institutional protocols and algorithms that pertain to the evaluation of patients at risk for COVID-19 are in a state of rapid change based on information released by regulatory bodies including the CDC and federal and state organizations. These policies and algorithms were followed during the patient's care in the ED.  Portions of this note were generated with [employer]. Dictation errors may occur despite best attempts at proofreading.   Final Clinical Impression(s) / ED Diagnoses Final diagnoses:  Hyponatremia  Hypokalemia    Rx / DC Orders ED Discharge Orders    None       Desma Mcgregor 03/06/20 2054    Lucrezia Starch, MD 03/07/20 1640

## 2020-03-07 LAB — GLUCOSE, CAPILLARY: Glucose-Capillary: 121 mg/dL — ABNORMAL HIGH (ref 70–99)

## 2020-03-07 LAB — RENAL FUNCTION PANEL
Albumin: 3.7 g/dL (ref 3.5–5.0)
Anion gap: 11 (ref 5–15)
BUN: 6 mg/dL — ABNORMAL LOW (ref 8–23)
CO2: 27 mmol/L (ref 22–32)
Calcium: 9 mg/dL (ref 8.9–10.3)
Chloride: 88 mmol/L — ABNORMAL LOW (ref 98–111)
Creatinine, Ser: 0.87 mg/dL (ref 0.44–1.00)
GFR, Estimated: >60 mL/min (ref >60)
Glucose, Bld: 114 mg/dL — ABNORMAL HIGH (ref 70–99)
Phosphorus: 3.2 mg/dL (ref 2.5–4.6)
Potassium: 2.9 mmol/L — ABNORMAL LOW (ref 3.5–5.1)
Sodium: 126 mmol/L — ABNORMAL LOW (ref 135–145)

## 2020-03-07 LAB — MAGNESIUM: Magnesium: 2 mg/dL (ref 1.7–2.4)

## 2020-03-07 LAB — HIV ANTIBODY (ROUTINE TESTING W REFLEX): HIV Screen 4th Generation wRfx: NONREACTIVE

## 2020-03-07 LAB — POTASSIUM: Potassium: 2.6 mmol/L — CL (ref 3.5–5.1)

## 2020-03-07 LAB — SODIUM
Sodium: 121 mmol/L — ABNORMAL LOW (ref 135–145)
Sodium: 128 mmol/L — ABNORMAL LOW (ref 135–145)
Sodium: 127 mmol/L — ABNORMAL LOW (ref 135–145)
Sodium: 126 mmol/L — ABNORMAL LOW (ref 135–145)
Sodium: 122 mmol/L — ABNORMAL LOW (ref 135–145)
Sodium: 118 mmol/L — CL (ref 135–145)
Sodium: 114 mmol/L — CL (ref 135–145)
Sodium: 111 mmol/L — CL (ref 135–145)

## 2020-03-07 LAB — PHOSPHORUS: Phosphorus: 3.2 mg/dL (ref 2.5–4.6)

## 2020-03-07 LAB — MRSA PCR SCREENING: MRSA by PCR: NEGATIVE

## 2020-03-07 MED ORDER — CHLORHEXIDINE GLUCONATE CLOTH 2 % EX PADS
6.0000 | MEDICATED_PAD | Freq: Every day | CUTANEOUS | Status: DC
  Administered 2020-03-08 – 2020-03-09 (×2): 6 via TOPICAL

## 2020-03-07 MED ORDER — DESMOPRESSIN ACETATE 4 MCG/ML IJ SOLN
4.0000 ug | Freq: Four times a day (QID) | INTRAMUSCULAR | Status: DC
  Administered 2020-03-07 (×5): 4 ug via INTRAVENOUS
  Filled 2020-03-07 (×6): qty 1

## 2020-03-07 MED ORDER — SODIUM CHLORIDE 0.9% FLUSH
10.0000 mL | Freq: Two times a day (BID) | INTRAVENOUS | Status: DC
  Administered 2020-03-07 – 2020-03-10 (×6): 10 mL
  Administered 2020-03-11: 20 mL
  Administered 2020-03-11 – 2020-03-13 (×4): 10 mL

## 2020-03-07 MED ORDER — DEXTROSE 5 % IV BOLUS
250.0000 mL | Freq: Once | INTRAVENOUS | Status: AC
  Administered 2020-03-07: 250 mL via INTRAVENOUS

## 2020-03-07 MED ORDER — POTASSIUM CHLORIDE CRYS ER 20 MEQ PO TBCR: 40.0000 meq | EXTENDED_RELEASE_TABLET | Freq: Once | ORAL | Status: DC

## 2020-03-07 MED ORDER — DEXTROSE 5 % IV SOLN: INTRAVENOUS | Status: DC

## 2020-03-07 MED ORDER — POTASSIUM CHLORIDE 10 MEQ/100ML IV SOLN
10.0000 meq | INTRAVENOUS | Status: AC
  Administered 2020-03-07 (×6): 10 meq via INTRAVENOUS
  Filled 2020-03-07 (×6): qty 100

## 2020-03-07 MED ORDER — SODIUM CHLORIDE 0.9% FLUSH
10.0000 mL | INTRAVENOUS | Status: DC | PRN
Start: 2020-03-07 — End: 2020-03-13

## 2020-03-07 MED ORDER — DEXTROSE 5 % IV BOLUS
500.0000 mL | Freq: Once | INTRAVENOUS | Status: AC
  Administered 2020-03-07: 500 mL via INTRAVENOUS

## 2020-03-07 MED ORDER — POTASSIUM CHLORIDE 10 MEQ/100ML IV SOLN
10.0000 meq | INTRAVENOUS | Status: AC
  Administered 2020-03-07 – 2020-03-08 (×6): 10 meq via INTRAVENOUS
  Filled 2020-03-07 (×6): qty 100

## 2020-03-07 NOTE — Progress Notes (Signed)
eLink Physician-Brief Progress Note Patient Name: Dawn [name] DOB: 07/01/1955 MRN: [id number]   Date of Service  03/07/2020  HPI/Events of Note  Still with dizziness and ringing in the ears. Most recent Na 114 (goal 111-113). Received DDAVP. Admission MRI negative  eICU Interventions  Continue closely monitoring Na     Intervention Category Major Interventions: Electrolyte abnormality - evaluation and management  Judd Lien 03/07/2020, 9:02 PM

## 2020-03-07 NOTE — Progress Notes (Addendum)
eLink Physician-Brief Progress Note Patient Name: Dawn [name] DOB: [birth year] MRN: [id number]   Date of Service  03/07/2020  HPI/Events of Note  Notified by pharm ok K 2.6 from 2.9 fo replacement with 60 meqs K  eICU Interventions  Ordered another K 60 meqs to run for 6 hours  Na now at 111. Will need to hold due dose DDAVP     Intervention Category Major Interventions: Electrolyte abnormality - evaluation and management  Judd Lien 03/07/2020, 10:44 PM

## 2020-03-07 NOTE — Progress Notes (Signed)
[facility] Progress Note   Subjective:   Seen in room - no acute issues.   Objective Vitals:   03/07/20 0300 03/07/20 0400 03/07/20 0500 03/07/20 0600  BP: 112/90 (!) 98/57 96/60 109/62  Pulse: 71 67 69 69  Resp: 14 15 14 11   Temp:  97.6 F (36.4 C)    TempSrc:  Axillary    SpO2: 98% 97% 97% 98%  Weight:   52.2 kg   Height:       Physical Exam General: moving about bed easily, awake and alert Heart:RRR Lungs: clear Abdomen:soft Extremities:no edema Neuro:  Nonfocal, talking, moving 4 extremities without issue  Additional Objective Labs: Basic Metabolic Panel: Recent Labs  Lab 03/06/20 1115 03/06/20 1328 03/06/20 1618 03/06/20 1721 03/06/20 2200 03/07/20 0237  NA QUESTIONABLE RESULTS, RECOMMEND RECOLLECT TO VERIFY 108* 106* 107* 114* 121*  K QUESTIONABLE RESULTS, RECOMMEND RECOLLECT TO VERIFY 3.0* 2.9* 2.9*  --   --   CL QUESTIONABLE RESULTS, RECOMMEND RECOLLECT TO VERIFY <65*  --  67*  --   --   CO2 23 28  --  20*  --   --   GLUCOSE 223* 181*  --  178*  --   --   BUN 5* <5*  --  5*  --   --   CREATININE 0.92 0.87  --  0.86  --   --   CALCIUM 9.0 9.2  --  9.1  --   --   PHOS  --   --   --   --   --  3.2   Liver Function Tests: Recent Labs  Lab 03/06/20 1721  AST 30  ALT 10  ALKPHOS 81  BILITOT 0.9  PROT 7.0  ALBUMIN 3.9   Recent Labs  Lab 03/06/20 1720  LIPASE 23   CBC: Recent Labs  Lab 03/06/20 1618 03/06/20 1720 03/06/20 2200  WBC  --  8.2 7.8  NEUTROABS  --  6.5  --   HGB 12.6 11.3* 9.9*  HCT 37.0 30.1* 26.9*  MCV  --  73.6* 73.9*  PLT  --  258 237   Blood Culture No results found for: SDES, SPECREQUEST, CULT, REPTSTATUS  Cardiac Enzymes: No results for input(s): CKTOTAL, CKMB, CKMBINDEX, TROPONINI in the last 168 hours. CBG: Recent Labs  Lab 03/07/20 0435  GLUCAP 121*   Iron Studies: No results for input(s): IRON, TIBC, TRANSFERRIN, FERRITIN in the last 72 hours. @lablastinr3 @ Studies/Results: CT HEAD WO  CONTRAST  Result Date: 03/06/2020 CLINICAL DATA:  Headache and dizziness EXAM: CT HEAD WITHOUT CONTRAST TECHNIQUE: Contiguous axial images were obtained from the base of the skull through the vertex without intravenous contrast. COMPARISON:  Head CT August 29, 2009 FINDINGS: Brain: No evidence of acute infarction, hemorrhage, hydrocephalus, extra-axial collection or mass lesion/mass effect. Mild (5 mm) of cerebellar tonsillar ectopy, similar prior. Vascular: No hyperdense vessel or unexpected calcification. 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 Sinuses/Orbits: No acute finding. Other: None. IMPRESSION: 1. No acute intracranial findings. Electronically Signed   By: Dahlia Bailiff MD   On: 03/06/2020 12:27   MR BRAIN WO CONTRAST  Result Date: 03/06/2020 CLINICAL DATA:  Nonspecific dizziness.  Headache. EXAM: MRI HEAD WITHOUT CONTRAST TECHNIQUE: Multiplanar, multiecho pulse sequences of the brain and surrounding structures were obtained without intravenous contrast. COMPARISON:  Head CT earlier same day  FINDINGS: Brain: The brain has a normal appearance without evidence of malformation, atrophy, old or acute small or large vessel infarction, mass lesion, hemorrhage, hydrocephalus or extra-axial collection. Vascular: Major vessels at the base of the brain show flow. Venous sinuses appear patent. Skull and upper cervical spine: Normal. Sinuses/Orbits: Clear/normal. Other: None significant. No fluid in the middle ears or mastoid regions. IMPRESSION: Normal examination. No abnormality seen to explain the presenting symptoms. Electronically Signed   By: Nelson Chimes M.D.   On: 03/06/2020 21:30   DG Chest Portable 1 View  Result Date: 03/06/2020 CLINICAL DATA:  Chest pain EXAM: PORTABLE CHEST 1 VIEW COMPARISON:  05/19/2019 FINDINGS: The heart size and mediastinal  contours are within normal limits. Both lungs are clear. The visualized skeletal structures are unremarkable. IMPRESSION: No active disease. Electronically Signed   By: Inez Catalina M.D.   On: 03/06/2020 16:42   Medications: . famotidine (PEPCID) IV Stopped (03/06/20 2151)   . Chlorhexidine Gluconate Cloth  6 each Topical Q0600  . desmopressin  4 mcg Intravenous QID  . heparin  5,000 Units Subcutaneous Q8H     Assessment/Plan:  1.  Hypotonic hyponatremia:  Serum sodium initially 107, K 2.9 with Urine osms 82, urine Na < 10, Serum osms are 227 consistent with low solute intake.  After solute repletion with 0.9% saline and KCl her serum sodium has increased to 115 at 2200 and 121 at 02:37.   --Stop solute repletion and rcheck BMP now and q2h for now --> d/w RN, phlebotomy to come now and send specimen STAT.  Will most likely be 48h of frequent labs so a midline or central line may be easier if phlebotomy short staffed.  --Give DDAVP 30mcg IV QID starting now --Will likely need D5W to lower sodium back down for an appropriate rate of correction.   --Goal is serum sodium of 111-113 into this afternoon to minimize risk for ODS. --Maintain NPO for now  --Cont to hold HCTZ and losartan  -- hold meloxicam  2.  Hypokalemia: - likely d/t HCTZ - Repleted and rechecking now  3.  HTN: - well controlled at present - if agent is needed, would start amlodipine   Jannifer Hick MD 03/07/2020, 6:51 AM  [facility] Pager: [phone]

## 2020-03-07 NOTE — Progress Notes (Addendum)
NAMEAdrie [name] MRN:  [id number] DOB:  1955/06/01 LOS: 1 ADMISSION DATE:  03/06/2020 DATE OF SERVICE:  03/06/2020  CHIEF COMPLAINT:  Numbness, tingling, blurred vision   HISTORY & PHYSICAL  History of Present Illness  This 65 y.o. Asian [place]) female reformed smoker presented to the [facility] Emergency Department today with complaints of numbness and tingling and blurred vision.  She also had a bout of emesis upon arrival in the ER. According to the patient's daughter, the patient was started on new medications on 02/18/2020 (losartan, HCTZ, gabapentin, meloxicam) and subsequently developed blurry vision and worsening numbness.  The reports that she takes 2 medications (presumably meloxicam and gabapentin for pain but states that her diffuse numbness is new (onset 1 week ago).  Initial ER evaluation reveals Na 107.  Head CT shows no acute intracranial process.  No evidence of cerebral edema.  No seizure activity.  No loss of consciousness.  The patient has been seen by the Nephrology service, who has recommended initiating treatment with 3% saline, warranting monitoring in an ICU setting.  Procedures:  N/A  Significant Diagnostic Tests:  CT head shows no acute intracranial process.  CXR shows no acute cardiopulmonary process.  Micro Data:   Negative Covid  Antimicrobials:  N/A   Interim history/subjective:  Remains in the intensive care unit close observation with correction of sodium.    Objective   BP 100/60   Pulse 68   Temp 97.6 F (36.4 C) (Axillary)   Resp 15   Ht 5\' 1"  (1.549 m)   Wt 52.2 kg   SpO2 97%   BMI 21.74 kg/m     Filed Weights   03/06/20 1116 03/06/20 1124 03/07/20 0500  Weight: 53.5 kg 53.5 kg 52.2 kg    Intake/Output Summary (Last 24 hours) at 03/07/2020 1033 Last data filed at 03/07/2020 0600 Gross per 24 hour  Intake --  Output 2570 ml  Net -2570 ml        Examination: General: Well-nourished well-developed Asian  female in no acute distress.  Complains of neck pain radiating to posterior fossa HEENT:No JVD or lymphadenopathy is appreciated Neuro: Grossly intact without focal defect CV: Heart sounds are regular PULM: Diminished breath sounds in the bases GI: soft, bsx4 active  GU: Foley catheter with amber urine Extremities: warm/dry, without edema  Skin: no rashes or lesions    Resolved Hospital Problem list   N/A   Assessment & Plan:   Hypotonic hyponatremia with overcorrection Hypokalemia Urinary retention Recent Labs  Lab 03/06/20 2200 03/07/20 0237 03/07/20 0812  NA 114* 121* 128*  126*   Recent Labs  Lab 03/06/20 1618 03/06/20 1721 03/07/20 0812  K 2.9* 2.9* 2.9*     Appreciate nephrology's input Currently on D5W Given DDAVP by nephrology Surgery will be mets Goal is sodium one 11-1 13 Minimize risk for overcorrection Currently n.p.o. Monitor replete potassium as needed FOLEY CATH  History of hypertension Hold hydrochlorothiazide Continue losartan  Chronic pain and head Monitor and treat as needed  Best practice:  Diet: NPO for now Pain/Anxiety/Delirium protocol (if indicated): N/A VAP protocol (if indicated): N/A DVT prophylaxis: heparin GI prophylaxis: famotidine Glucose control: N/A Mobility/Activity: bedrest for now   Code Status: Full Code Family Communication: 03/07/2020 patient updated at bedside Disposition:    Labs   CBC: Recent Labs  Lab 03/06/20 1618 03/06/20 1720 03/06/20 2200  WBC  --  8.2 7.8  NEUTROABS  --  6.5  --  HGB 12.6 11.3* 9.9*  HCT 37.0 30.1* 26.9*  MCV  --  73.6* 73.9*  PLT  --  258 694    Basic Metabolic Panel: Recent Labs  Lab 03/06/20 1115 03/06/20 1328 03/06/20 1618 03/06/20 1721 03/06/20 2200 03/07/20 0237 03/07/20 0812  NA QUESTIONABLE RESULTS, RECOMMEND RECOLLECT TO VERIFY 108* 106* 107* 114* 121* 128*  126*  K QUESTIONABLE RESULTS, RECOMMEND RECOLLECT TO VERIFY 3.0* 2.9* 2.9*  --   --  2.9*   CL QUESTIONABLE RESULTS, RECOMMEND RECOLLECT TO VERIFY <65*  --  67*  --   --  88*  CO2 23 28  --  20*  --   --  27  GLUCOSE 223* 181*  --  178*  --   --  114*  BUN 5* <5*  --  5*  --   --  6*  CREATININE 0.92 0.87  --  0.86  --   --  0.87  CALCIUM 9.0 9.2  --  9.1  --   --  9.0  MG  --   --   --   --  1.8 2.0  --   PHOS  --   --   --   --   --  3.2 3.2   GFR: Estimated Creatinine Clearance: 49.9 mL/min (by C-G formula based on SCr of 0.87 mg/dL). Recent Labs  Lab 03/06/20 1720 03/06/20 2200  WBC 8.2 7.8    Liver Function Tests: Recent Labs  Lab 03/06/20 1721 03/07/20 0812  AST 30  --   ALT 10  --   ALKPHOS 81  --   BILITOT 0.9  --   PROT 7.0  --   ALBUMIN 3.9 3.7   Recent Labs  Lab 03/06/20 1720  LIPASE 23   No results for input(s): AMMONIA in the last 168 hours.  ABG    Component Value Date/Time   HCO3 31.6 (H) 03/06/2020 1618   TCO2 33 (H) 03/06/2020 1618   O2SAT 100.0 03/06/2020 1618     Coagulation Profile: No results for input(s): INR, PROTIME in the last 168 hours.  Cardiac Enzymes: No results for input(s): CKTOTAL, CKMB, CKMBINDEX, TROPONINI in the last 168 hours.  HbA1C: Hgb A1c MFr Bld  Date/Time Value Ref Range Status  03/06/2020 10:00 PM 4.6 (L) 4.8 - 5.6 % Final    Comment:    (NOTE) Pre diabetes:          5.7%-6.4%  Diabetes:              >6.4%  Glycemic control for   <7.0% adults with diabetes     CBG: Recent Labs  Lab 03/07/20 Mathews 30 min   Richardson Landry Minor ACNP Acute Care Nurse Practitioner [place] Please consult Wyatt 03/07/2020, 10:33 AM  Pulmonary critical care attending:  This is a 65 year old female admitted for hyponatremia.  Initial presentation was 107.  Sodium has corrected up to 127 rather quickly.  Nephrology has also seen patient and given recommendations regarding DDAVP plus D5 bolus to help decrease risk of osmotic demyelinating syndrome.  BP 100/60    Pulse 68   Temp 98.5 F (36.9 C) (Oral)   Resp 15   Ht 5\' 1"  (1.549 m)   Wt 52.2 kg   SpO2 97%   BMI 21.74 kg/m   General: Female resting in bed alert to voice Neuro: Moves all 4 extremities tracks appropriately Heart: Regular rhythm, S1-S2  lungs: Clear to auscultation bilaterally No wheeze Abdomen: Soft nontender nondistended  Labs: Reviewed  Assessment: Hypotonic hyponatremia -Was on hydrochlorothiazide at home Hypokalemia History of hypertension Chronic pain  Plan: DDAVP plus D5W given today by nephrology Close observation in the ICU with neuro checks Attempt to minimize overcorrection q2-hour sodium checks have been ordered. Holding hydrochlorothiazide  This patient is critically ill with multiple organ system failure; which, requires frequent high complexity decision making, assessment, support, evaluation, and titration of therapies. This was completed through the application of advanced monitoring technologies and extensive interpretation of multiple databases. During this encounter critical care time was devoted to patient care services described in this note for 32 minutes.  Garner Nash, DO [facility] 03/07/2020 3:34 PM

## 2020-03-08 ENCOUNTER — Inpatient Hospital Stay (HOSPITAL_COMMUNITY): Payer: Self-pay

## 2020-03-08 LAB — SODIUM
Sodium: 107 mmol/L — CL (ref 135–145)
Sodium: 108 mmol/L — CL (ref 135–145)
Sodium: 108 mmol/L — CL (ref 135–145)
Sodium: 108 mmol/L — CL (ref 135–145)
Sodium: 109 mmol/L — CL (ref 135–145)
Sodium: 109 mmol/L — CL (ref 135–145)
Sodium: 110 mmol/L — CL (ref 135–145)
Sodium: 110 mmol/L — CL (ref 135–145)
Sodium: 111 mmol/L — CL (ref 135–145)
Sodium: 112 mmol/L — CL (ref 135–145)
Sodium: 114 mmol/L — CL (ref 135–145)

## 2020-03-08 LAB — BASIC METABOLIC PANEL
Anion gap: 8 (ref 5–15)
BUN: <5 mg/dL — ABNORMAL LOW (ref 8–23)
CO2: 25 mmol/L (ref 22–32)
Calcium: 8 mg/dL — ABNORMAL LOW (ref 8.9–10.3)
Chloride: 78 mmol/L — ABNORMAL LOW (ref 98–111)
Creatinine, Ser: 0.69 mg/dL (ref 0.44–1.00)
GFR, Estimated: >60 mL/min (ref >60)
Glucose, Bld: 112 mg/dL — ABNORMAL HIGH (ref 70–99)
Potassium: 3.3 mmol/L — ABNORMAL LOW (ref 3.5–5.1)
Sodium: 111 mmol/L — CL (ref 135–145)

## 2020-03-08 LAB — OSMOLALITY, URINE: Osmolality, Ur: 342 mOsm/kg (ref 300–900)

## 2020-03-08 LAB — PHOSPHORUS: Phosphorus: 2.4 mg/dL — ABNORMAL LOW (ref 2.5–4.6)

## 2020-03-08 LAB — MAGNESIUM: Magnesium: 1.6 mg/dL — ABNORMAL LOW (ref 1.7–2.4)

## 2020-03-08 LAB — POTASSIUM: Potassium: 3.5 mmol/L (ref 3.5–5.1)

## 2020-03-08 LAB — SODIUM, URINE, RANDOM: Sodium, Ur: 110 mmol/L

## 2020-03-08 MED ORDER — ACETAMINOPHEN 325 MG PO TABS
650.0000 mg | ORAL_TABLET | ORAL | Status: DC | PRN
  Administered 2020-03-08 – 2020-03-13 (×16): 650 mg via ORAL
  Filled 2020-03-08 (×16): qty 2

## 2020-03-08 MED ORDER — TRAMADOL HCL 50 MG PO TABS
25.0000 mg | ORAL_TABLET | Freq: Once | ORAL | Status: AC
  Administered 2020-03-08: 25 mg via ORAL
  Filled 2020-03-08: qty 1

## 2020-03-08 MED ORDER — POTASSIUM CHLORIDE 10 MEQ/100ML IV SOLN: 10.0000 meq | INTRAVENOUS | Status: DC

## 2020-03-08 MED ORDER — SODIUM CHLORIDE 3 % IV SOLN
20.0000 mL/h | INTRAVENOUS | Status: DC
  Administered 2020-03-08: 20 mL/h via INTRAVENOUS
  Filled 2020-03-08 (×2): qty 500

## 2020-03-08 MED ORDER — POTASSIUM CHLORIDE 20 MEQ PO PACK
40.0000 meq | PACK | Freq: Once | ORAL | Status: AC
  Administered 2020-03-08: 40 meq via ORAL
  Filled 2020-03-08: qty 2

## 2020-03-08 MED ORDER — MAGNESIUM SULFATE 2 GM/50ML IV SOLN
2.0000 g | Freq: Once | INTRAVENOUS | Status: AC
  Administered 2020-03-08: 2 g via INTRAVENOUS
  Filled 2020-03-08: qty 50

## 2020-03-08 MED ORDER — DESMOPRESSIN ACETATE 4 MCG/ML IJ SOLN
4.0000 ug | Freq: Four times a day (QID) | INTRAMUSCULAR | Status: DC
  Filled 2020-03-08: qty 1

## 2020-03-08 MED ORDER — SODIUM CHLORIDE 3 % IV SOLN
INTRAVENOUS | Status: DC
  Filled 2020-03-08 (×2): qty 500

## 2020-03-08 MED ORDER — OXYCODONE-ACETAMINOPHEN 5-325 MG PO TABS
1.0000 | ORAL_TABLET | Freq: Four times a day (QID) | ORAL | Status: DC | PRN
  Administered 2020-03-08 – 2020-03-13 (×16): 1 via ORAL
  Filled 2020-03-08 (×16): qty 1

## 2020-03-08 MED ORDER — POTASSIUM PHOSPHATES 15 MMOLE/5ML IV SOLN
20.0000 mmol | Freq: Once | INTRAVENOUS | Status: DC
  Filled 2020-03-08: qty 6.67

## 2020-03-08 MED ORDER — POTASSIUM CHLORIDE 10 MEQ/100ML IV SOLN
10.0000 meq | INTRAVENOUS | Status: AC
  Administered 2020-03-08 (×2): 10 meq via INTRAVENOUS
  Filled 2020-03-08 (×2): qty 100

## 2020-03-08 MED ORDER — IBUPROFEN 200 MG PO TABS: 400.0000 mg | ORAL_TABLET | Freq: Once | ORAL | Status: DC

## 2020-03-08 MED ORDER — MORPHINE SULFATE (PF) 2 MG/ML IV SOLN
0.5000 mg | Freq: Once | INTRAVENOUS | Status: AC
  Administered 2020-03-08: 0.5 mg via INTRAVENOUS
  Filled 2020-03-08: qty 1

## 2020-03-08 NOTE — Progress Notes (Addendum)
[name] MRN:  [id number] DOB:  [birth year] LOS: 2 ADMISSION DATE:  03/06/2020 DATE OF SERVICE:  03/06/2020 CHIEF COMPLAINT:  Numbness, tingling, blurred vision    History of Present Illness  65 y/o [place] F, former smoker, admitted 1/24 with complaints of numbness and tingling and blurred vision.  She also had a bout of emesis upon arrival in the ER. According to the patient's daughter, the patient was started on new medications on 02/18/2020 (losartan, HCTZ, gabapentin, meloxicam) and subsequently developed blurry vision and worsening numbness.  The reports that she takes 2 medications (presumably meloxicam and gabapentin for pain but states that her diffuse numbness is new (onset 1 week ago).  Initial ER evaluation revealed Na 107.  Head CT was negative for acute intracranial process.  No evidence of cerebral edema.  No seizure activity.  No loss of consciousness.  The patient has been seen by the Nephrology service, who has recommended initiating treatment with 3% saline, warranting monitoring in an ICU setting.  Procedures:     Significant Diagnostic Tests:   CT head 1/24 >> no acute intracranial process.     Micro Data:  COVID 1/24 >> negative  MRSA PCR 1/25 >>   Antimicrobials:    Interim history/subjective:  Afebrile Pt c/o headache, asking for something to sleep and pain medicine   Objective   BP (!) 163/66 (BP Location: Left Arm)   Pulse 71   Temp 98.2 F (36.8 C) (Axillary)   Resp 20   Ht 5\' 1"  (1.549 m)   Wt 52.2 kg   SpO2 98%   BMI 21.74 kg/m     Filed Weights   03/06/20 1116 03/06/20 1124 03/07/20 0500  Weight: 53.5 kg 53.5 kg 52.2 kg    Intake/Output Summary (Last 24 hours) at 03/08/2020 1256 Last data filed at 03/08/2020 1250 Gross per 24 hour  Intake 2091.07 ml  Output 2000 ml  Net 91.07 ml        Examination: General: adult female sitting up in bed, NAD HEENT: MM pink/dry, no jvd, anicteric, speech clear, pupils =/reactive  Neuro:  AAOx4, speech clear, MAE  CV: s1s2 RRR, no m/r/g PULM: non-labored on RA, lungs bilaterally clear GI: soft, bsx4 active  Extremities: warm/dry, no edema  Skin: no rashes or lesions  Resolved Hospital Problem list      Assessment & Plan:   Hypotonic Hyponatremia  Initial sodium 107, corrected to 128.  Given DDAVP, D5w.  Thought secondary to poor intake / HCTZ -management per Nephrology  -3% NS per Nephrology  -Q2 hour Na monitoring  -monitor urine sodium, osmolality -goal Na 117 by evening  -avoid overcorrection, goal correction 4-8 mEq per 24 hours  -consider diet this evening  Hypokalemia Urinary retention -monitor I/O  -replace electrolytes as indicated   Hypertension -hold HCTZ  -continue losartan  Chronic pain and headache -PRN pain control  -minimize sedating agents to allow for neuro exam   Best practice:  Diet: NPO   Pain/Anxiety/Delirium protocol (if indicated): N/A VAP protocol (if indicated): N/A DVT prophylaxis: heparin GI prophylaxis: famotidine Glucose control: N/A Mobility/Activity: BR Code Status: Full Code  Family Communication: Patient updated on plan of care  Disposition: ICU, to [facility] 1/27 am.   Labs   CBC: Recent Labs  Lab 03/06/20 1618 03/06/20 1720 03/06/20 2200  WBC  --  8.2 7.8  NEUTROABS  --  6.5  --   HGB 12.6 11.3* 9.9*  HCT 37.0 30.1* 26.9*  MCV  --  73.6* 73.9*  PLT  --  258 024    Basic Metabolic Panel: Recent Labs  Lab 03/06/20 1115 03/06/20 1328 03/06/20 1618 03/06/20 1721 03/06/20 2200 03/07/20 0237 03/07/20 0812 03/07/20 1127 03/07/20 2130 03/07/20 2330 03/08/20 0130 03/08/20 0330 03/08/20 0530 03/08/20 0727 03/08/20 1112  NA QUESTIONABLE RESULTS, RECOMMEND RECOLLECT TO VERIFY 108* 106* 107* 114* 121* 128*  126*   < > 111*   < > 111* 111* 110* 109* 107*  K QUESTIONABLE RESULTS, RECOMMEND RECOLLECT TO VERIFY 3.0* 2.9* 2.9*  --   --  2.9*  --  2.6*  --   --  3.3*  --   --   --   CL QUESTIONABLE RESULTS,  RECOMMEND RECOLLECT TO VERIFY <65*  --  67*  --   --  88*  --   --   --   --  78*  --   --   --   CO2 23 28  --  20*  --   --  27  --   --   --   --  25  --   --   --   GLUCOSE 223* 181*  --  178*  --   --  114*  --   --   --   --  112*  --   --   --   BUN 5* <5*  --  5*  --   --  6*  --   --   --   --  <5*  --   --   --   CREATININE 0.92 0.87  --  0.86  --   --  0.87  --   --   --   --  0.69  --   --   --   CALCIUM 9.0 9.2  --  9.1  --   --  9.0  --   --   --   --  8.0*  --   --   --   MG  --   --   --   --  1.8 2.0  --   --   --   --   --  1.6*  --   --   --   PHOS  --   --   --   --   --  3.2 3.2  --   --   --   --  2.4*  --   --   --    < > = values in this interval not displayed.   GFR: Estimated Creatinine Clearance: 54.3 mL/min (by C-G formula based on SCr of 0.69 mg/dL). Recent Labs  Lab 03/06/20 1720 03/06/20 2200  WBC 8.2 7.8    Liver Function Tests: Recent Labs  Lab 03/06/20 1721 03/07/20 0812  AST 30  --   ALT 10  --   ALKPHOS 81  --   BILITOT 0.9  --   PROT 7.0  --   ALBUMIN 3.9 3.7   Recent Labs  Lab 03/06/20 1720  LIPASE 23   No results for input(s): AMMONIA in the last 168 hours.  ABG    Component Value Date/Time   HCO3 31.6 (H) 03/06/2020 1618   TCO2 33 (H) 03/06/2020 1618   O2SAT 100.0 03/06/2020 1618     Coagulation Profile: No results for input(s): INR, PROTIME in the last 168 hours.  Cardiac Enzymes: No results for input(s): CKTOTAL, CKMB, CKMBINDEX, TROPONINI in the last 168 hours.  HbA1C: Hgb A1c MFr Bld  Date/Time Value Ref Range Status  03/06/2020 10:00 PM 4.6 (L) 4.8 - 5.6 % Final    Comment:    (NOTE) Pre diabetes:          5.7%-6.4%  Diabetes:              >6.4%  Glycemic control for   <7.0% adults with diabetes     CBG: Recent Labs  Lab 03/07/20 0435  GLUCAP 121*    Critical Care Time:  31 minutes  Noe Gens, MSN, NP-C, AGACNP-BC [facility] 03/08/2020, 12:56 PM   Please see [email or web address]  for pager details.   Pulmonary critical care attending:  65 year old Dawn [name] female, former smoker admitted with numbness tingling blurred vision found to have a sodium of 107.  Admit to the intensive care unit for management of hyponatremia.  Patient received hypertonic saline with overcorrection as well followed by nephrology giving D5W to slow this down as well as DDAVP.  She continued to drop throughout the day.  Patient complaining today of headache overall neurologically intact moves all 4 extremities.  Discussed case with nephrology today.  Decision to give now additional small dose of 3% to bring her back up a little higher.  Continue to follow sodiums every 2 hours.  BP (!) 157/70   Pulse 65   Temp 98.1 F (36.7 C) (Oral)   Resp 15   Ht 5\' 1"  (1.549 m)   Wt 52.2 kg   SpO2 98%   BMI 21.74 kg/m   General: Female resting in bed complaining of headache HEENT: Tracking appropriately NCAT Heart: Regular rhythm S1-S2 Lungs: Clear to auscultation bilaterally Neuro: Moves all 4 extremities no focal deficit.  Labs: Reviewed, serum sodiums and also reviewed with nephrology today.  Assessment: Severe hyponatremia with neurologic symptoms to include headache and confusion on presentation. Possibly related to hydrochlorothiazide versus poor intake Hypokalemia Urinary retention  Plan: Sodium management per nephrology at this time. Remains in ICU to give additional 3% however I suspect stable step down unit tomorrow. Goal is to avoid overcorrection Goal correction is at 4 to 8 mEq per 24 hours. Plan to consider advancing diet Discussed with nephrology.  We appreciate recommendations.  Agree with transfer to [facility] hospitalist service for tomorrow.  Garner Nash, DO [facility] 03/08/2020 6:28 PM

## 2020-03-08 NOTE — Progress Notes (Signed)
Nephrology update:   Serum sodium has continued to trend down through the morning after reduction and then discontinuation of D5W and DDAVP overnight.    Repeats include 0727 - 109; 1112 107 Repeat urine sodium up from <10 yesterday to 110 this morning Serum K is now 3.3 after heavy repletion Repeat urine osm in process  Pt with HA still which is new since yesterday, noted overnight I have elected to proceed with 3% hypertonic saline at this point with goal serum sodium of 111 and then 113 -115 tonight.   She may require just short course of 3% saline but given the symptoms I think this is indicated.  We will continue q2h sodiums at this point to minimize the risk of overcorrection..   RN has discussed with PCCM so appropriate access can be in place so we can obtain frequent labs.

## 2020-03-08 NOTE — Progress Notes (Signed)
eLink Physician-Brief Progress Note Patient Name: Dawn [name] DOB: 02-11-1956 MRN: [id number]   Date of Service  03/08/2020  HPI/Events of Note  Notified of Na 111, K 3.3 ongoing 6th run of K, Mg 1.6 and Phos 2.4  eICU Interventions  Ordered kphos 20 mmol and Mg 1 g     Intervention Category Major Interventions: Electrolyte abnormality - evaluation and management  Judd Lien 03/08/2020, 5:21 AM

## 2020-03-08 NOTE — Progress Notes (Addendum)
Informed RN Elzie Rings from E link latest sodium level=109 from 108, pt vomited  With yellow watery emesis, RN Elzie Rings said will inform the DR and will order for medicine for vomiting

## 2020-03-08 NOTE — Progress Notes (Signed)
12 leag ECG done, bld sample sent for sodium level

## 2020-03-08 NOTE — Progress Notes (Addendum)
[facility] Progress Note   Subjective:   Seen in room - having back pain.  I wasn't able to get the video interpreter to work nor was there an answer at the telephone interpreter services.  I asked the RN to do a neuro ROS and call me with any issues.  She is awake and alert.   Objective Vitals:   03/07/20 1800 03/07/20 1900 03/07/20 2343 03/08/20 0351  BP: (!) 145/59 130/62    Pulse: 81 72    Resp: 19 18    Temp:   97.9 F (36.6 C) 98.3 F (36.8 C)  TempSrc:   Oral Oral  SpO2: 100% 99%    Weight:      Height:       Physical Exam General: moving about bed easily, awake and alert but moaning due to pain.  Heart:RRR Lungs: clear Abdomen:soft Extremities:no edema Neuro:  Nonfocal, talking, moving 4 extremities without issue  Additional Objective Labs: Basic Metabolic Panel: Recent Labs  Lab 03/06/20 1721 03/06/20 2200 03/07/20 0237 03/07/20 0812 03/07/20 1127 03/07/20 2130 03/07/20 2330 03/08/20 0130 03/08/20 0330 03/08/20 0530  NA 107*   < > 121* 128*  126*   < > 111*   < > 111* 111* 110*  K 2.9*  --   --  2.9*  --  2.6*  --   --  3.3*  --   CL 67*  --   --  88*  --   --   --   --  78*  --   CO2 20*  --   --  27  --   --   --   --  25  --   GLUCOSE 178*  --   --  114*  --   --   --   --  112*  --   BUN 5*  --   --  6*  --   --   --   --  <5*  --   CREATININE 0.86  --   --  0.87  --   --   --   --  0.69  --   CALCIUM 9.1  --   --  9.0  --   --   --   --  8.0*  --   PHOS  --   --  3.2 3.2  --   --   --   --  2.4*  --    < > = values in this interval not displayed.   Liver Function Tests: Recent Labs  Lab 03/06/20 1721 03/07/20 0812  AST 30  --   ALT 10  --   ALKPHOS 81  --   BILITOT 0.9  --   PROT 7.0  --   ALBUMIN 3.9 3.7   Recent Labs  Lab 03/06/20 1720  LIPASE 23   CBC: Recent Labs  Lab 03/06/20 1618 03/06/20 1720 03/06/20 2200  WBC  --  8.2 7.8  NEUTROABS  --  6.5  --   HGB 12.6 11.3* 9.9*  HCT 37.0 30.1* 26.9*  MCV   --  73.6* 73.9*  PLT  --  258 237   Blood Culture No results found for: SDES, SPECREQUEST, CULT, REPTSTATUS  Cardiac Enzymes: No results for input(s): CKTOTAL, CKMB, CKMBINDEX, TROPONINI in the last 168 hours. CBG: Recent Labs  Lab 03/07/20 0435  GLUCAP 121*   Iron Studies: No results for input(s): IRON, TIBC, TRANSFERRIN, FERRITIN in the last 72 hours. @  lablastinr3@ Studies/Results: CT HEAD WO CONTRAST  Result Date: 03/06/2020 CLINICAL DATA:  Headache and dizziness EXAM: CT HEAD WITHOUT CONTRAST TECHNIQUE: Contiguous axial images were obtained from the base of the skull through the vertex without intravenous contrast. COMPARISON:  Head CT August 29, 2009 FINDINGS: Brain: No evidence of acute infarction, hemorrhage, hydrocephalus, extra-axial collection or mass lesion/mass effect. Mild (5 mm) of cerebellar tonsillar ectopy, similar prior. Vascular: No hyperdense vessel or unexpected calcification. 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 Sinuses/Orbits: No acute finding. Other: None. IMPRESSION: 1. No acute intracranial findings. Electronically Signed   By: Maudry Mayhew MD   On: 03/06/2020 12:27   MR BRAIN WO CONTRAST  Result Date: 03/06/2020 CLINICAL DATA:  Nonspecific dizziness.  Headache. EXAM: MRI HEAD WITHOUT CONTRAST TECHNIQUE: Multiplanar, multiecho pulse sequences of the brain and surrounding structures were obtained without intravenous contrast. COMPARISON:  Head CT earlier same day FINDINGS: Brain: The brain has a normal appearance without evidence of malformation, atrophy, old or acute small or large vessel infarction, mass lesion, hemorrhage, hydrocephalus or extra-axial collection. Vascular: Major vessels at the base of the brain show flow. Venous sinuses appear patent. Skull and upper cervical spine: Normal. Sinuses/Orbits:  Clear/normal. Other: None significant. No fluid in the middle ears or mastoid regions. IMPRESSION: Normal examination. No abnormality seen to explain the presenting symptoms. Electronically Signed   By: Paulina Fusi M.D.   On: 03/06/2020 21:30   DG Chest Port 1 View  Result Date: 03/08/2020 CLINICAL DATA:  Respiratory distress EXAM: PORTABLE CHEST 1 VIEW COMPARISON:  03/06/2020 FINDINGS: The heart size and mediastinal contours are within normal limits. Both lungs are clear. The visualized skeletal structures are unremarkable. IMPRESSION: No active disease. Electronically Signed   By: Helyn Numbers MD   On: 03/08/2020 05:49   DG Chest Portable 1 View  Result Date: 03/06/2020 CLINICAL DATA:  Chest pain EXAM: PORTABLE CHEST 1 VIEW COMPARISON:  05/19/2019 FINDINGS: The heart size and mediastinal contours are within normal limits. Both lungs are clear. The visualized skeletal structures are unremarkable. IMPRESSION: No active disease. Electronically Signed   By: Alcide Clever M.D.   On: 03/06/2020 16:42   Medications: . famotidine (PEPCID) IV 20 mg (03/07/20 2120)  . potassium PHOSPHATE IVPB (in mmol) Stopped (03/08/20 0622)   . Chlorhexidine Gluconate Cloth  6 each Topical Q0600  . heparin  5,000 Units Subcutaneous Q8H  . sodium chloride flush  10-40 mL Intracatheter Q12H     Assessment/Plan:  1.  Hypotonic hyponatremia:  Serum sodium initially 107, K 2.9 with Urine osms 82, urine Na < 10, Serum osms are 227 consistent with low solute intake.  After solute repletion with 0.9% saline and KCl her serum sodium overcorrected and she required both bolus and drip of D5W through yesterday.  Overnight serum sodium appropriate at 114 and drip decreased, this AM serum sodium 111 and D5W was stopped.   --Discussed with RN re q2h sodiums still today - next is pending  --Recheck urine sodium and osm now --Goal sodium ~117 this evening --Cont DDAVP IV QID for now --Maintain NPO for now, can start diet  later today when serum sodium stabilized --Cont to hold HCTZ and losartan  -- hold meloxicam  2.  Hypokalemia: - likely d/t HCTZ - Repleting  3.  HTN: - acceptably controlled at present - if  agent is needed, would start amlodipine   Page with new issues.   Jannifer Hick MD 03/08/2020, 8:20 AM  [facility] Pager: [phone]  ADDENDUM:  Despite off hypotonic IVF repeat serum sodium down further to 109 so will hold DDAVP for now.  Will have RN sent repeat urine studies that were ordered this AM.  Cont q2h sodiums

## 2020-03-08 NOTE — Progress Notes (Signed)
eLink Physician-Brief Progress Note Patient Name: Dawn [name] DOB: 1955-03-07 MRN: [id number]   Date of Service  03/08/2020  HPI/Events of Note  Complaining of chronic back pain  eICU Interventions  Tylenol ordered     Intervention Category Minor Interventions: Routine modifications to care plan (e.g. PRN medications for pain, fever)  Shona Needles Edoardo Laforte 03/08/2020, 12:53 AM

## 2020-03-08 NOTE — Progress Notes (Signed)
eLink Physician-Brief Progress Note Patient Name: Dawn [name] DOB: 10/10/1955 MRN: [id number]   Date of Service  03/08/2020  HPI/Events of Note  Patient vomited, needs a PRN medication for vomiting. Her last documented QTc was 528 msec.  eICU Interventions  Will obtain a stat 12 lead EKG now, if QTc back to normal will prescribe PRN Zofran. ADDENDUM: QTc 480 msec, Zofran ordered.        Frederik Pear 03/08/2020, 8:43 PM

## 2020-03-08 NOTE — Progress Notes (Signed)
eLink Physician-Brief Progress Note Patient Name: Masako Overall DOB: 1955/04/16 MRN: [id number]   Date of Service  03/08/2020  HPI/Events of Note  Received request for sleep aid  eICU Interventions  Would want to avoid medication that would affect neurologic evaluation given swings in sodium level     Intervention Category Minor Interventions: Other:  Judd Lien 03/08/2020, 12:09 AM

## 2020-03-09 LAB — CBC
HCT: 26.4 % — ABNORMAL LOW (ref 36.0–46.0)
Hemoglobin: 9.7 g/dL — ABNORMAL LOW (ref 12.0–15.0)
MCH: 27.9 pg (ref 26.0–34.0)
MCHC: 36.7 g/dL — ABNORMAL HIGH (ref 30.0–36.0)
MCV: 75.9 fL — ABNORMAL LOW (ref 80.0–100.0)
Platelets: 226 10*3/uL (ref 150–400)
RBC: 3.48 MIL/uL — ABNORMAL LOW (ref 3.87–5.11)
RDW: 12.6 % (ref 11.5–15.5)
WBC: 8.8 10*3/uL (ref 4.0–10.5)
nRBC: 0 % (ref 0.0–0.2)

## 2020-03-09 LAB — BASIC METABOLIC PANEL
Anion gap: 10 (ref 5–15)
Anion gap: 8 (ref 5–15)
BUN: <5 mg/dL — ABNORMAL LOW (ref 8–23)
BUN: 5 mg/dL — ABNORMAL LOW (ref 8–23)
CO2: 21 mmol/L — ABNORMAL LOW (ref 22–32)
CO2: 23 mmol/L (ref 22–32)
Calcium: 8.3 mg/dL — ABNORMAL LOW (ref 8.9–10.3)
Calcium: 8.5 mg/dL — ABNORMAL LOW (ref 8.9–10.3)
Chloride: 84 mmol/L — ABNORMAL LOW (ref 98–111)
Chloride: 86 mmol/L — ABNORMAL LOW (ref 98–111)
Creatinine, Ser: 0.68 mg/dL (ref 0.44–1.00)
Creatinine, Ser: 0.82 mg/dL (ref 0.44–1.00)
GFR, Estimated: >60 mL/min (ref >60)
GFR, Estimated: >60 mL/min (ref >60)
Glucose, Bld: 86 mg/dL (ref 70–99)
Glucose, Bld: 91 mg/dL (ref 70–99)
Potassium: 3.2 mmol/L — ABNORMAL LOW (ref 3.5–5.1)
Potassium: 3.7 mmol/L (ref 3.5–5.1)
Sodium: 115 mmol/L — CL (ref 135–145)
Sodium: 117 mmol/L — CL (ref 135–145)

## 2020-03-09 LAB — SODIUM
Sodium: 112 mmol/L — CL (ref 135–145)
Sodium: 114 mmol/L — CL (ref 135–145)
Sodium: 117 mmol/L — CL (ref 135–145)
Sodium: 122 mmol/L — ABNORMAL LOW (ref 135–145)
Sodium: 122 mmol/L — ABNORMAL LOW (ref 135–145)
Sodium: 121 mmol/L — ABNORMAL LOW (ref 135–145)
Sodium: 123 mmol/L — ABNORMAL LOW (ref 135–145)
Sodium: 124 mmol/L — ABNORMAL LOW (ref 135–145)
Sodium: 121 mmol/L — ABNORMAL LOW (ref 135–145)
Sodium: 124 mmol/L — ABNORMAL LOW (ref 135–145)
Sodium: 121 mmol/L — ABNORMAL LOW (ref 135–145)

## 2020-03-09 MED ORDER — SODIUM CHLORIDE 0.9 % IV SOLN: INTRAVENOUS | Status: DC

## 2020-03-09 MED ORDER — DEXTROSE 5 % IV BOLUS
500.0000 mL | Freq: Once | INTRAVENOUS | Status: AC
  Administered 2020-03-09: 500 mL via INTRAVENOUS

## 2020-03-09 MED ORDER — DEXTROSE 5 % IV SOLN: INTRAVENOUS | Status: DC

## 2020-03-09 MED ORDER — ONDANSETRON HCL 4 MG/2ML IJ SOLN
4.0000 mg | Freq: Four times a day (QID) | INTRAMUSCULAR | Status: AC | PRN
  Administered 2020-03-09 – 2020-03-13 (×4): 4 mg via INTRAVENOUS
  Filled 2020-03-09 (×4): qty 2

## 2020-03-09 MED ORDER — POTASSIUM CHLORIDE CRYS ER 20 MEQ PO TBCR
40.0000 meq | EXTENDED_RELEASE_TABLET | Freq: Two times a day (BID) | ORAL | Status: AC
  Administered 2020-03-09 (×2): 40 meq via ORAL
  Filled 2020-03-09 (×2): qty 2

## 2020-03-09 MED ORDER — DESMOPRESSIN ACETATE 4 MCG/ML IJ SOLN
2.0000 ug | Freq: Once | INTRAMUSCULAR | Status: AC
  Administered 2020-03-09: 2 ug via INTRAVENOUS
  Filled 2020-03-09 (×2): qty 1

## 2020-03-09 NOTE — Progress Notes (Signed)
[facility] Progress Note   Subjective:   Seen in room, HA improved.  No new issues.    Objective Vitals:   03/09/20 0700 03/09/20 0800 03/09/20 0836 03/09/20 0900  BP: (!) 129/58 129/62  (!) 120/57  Pulse: 67 62  65  Resp: 15 16  14   Temp:   98.1 F (36.7 C)   TempSrc:   Oral   SpO2:  98%    Weight:      Height:       Physical Exam General: moving about bed easily, appears comfortable. Heart:RRR Lungs: clear Abdomen:soft Extremities:no edema Neuro:  Nonfocal, talking, moving 4 extremities without issue  Additional Objective Labs: Basic Metabolic Panel: Recent Labs  Lab 03/07/20 0237 03/07/20 0812 03/07/20 1127 03/08/20 0330 03/08/20 0530 03/08/20 1302 03/08/20 1611 03/09/20 0545 03/09/20 0740 03/09/20 1024  NA 121* 128*  126*   < > 111*   < > 108*   < > 115*  117* 117* 122*  K  --  2.9*   < > 3.3*  --  3.5  --  3.2* 3.7  --   CL  --  88*  --  78*  --   --   --  84* 86*  --   CO2  --  27  --  25  --   --   --  21* 23  --   GLUCOSE  --  114*  --  112*  --   --   --  86 91  --   BUN  --  6*  --  <5*  --   --   --  <5* 5*  --   CREATININE  --  0.87  --  0.69  --   --   --  0.68 0.82  --   CALCIUM  --  9.0  --  8.0*  --   --   --  8.3* 8.5*  --   PHOS 3.2 3.2  --  2.4*  --   --   --   --   --   --    < > = values in this interval not displayed.   Liver Function Tests: Recent Labs  Lab 03/06/20 1721 03/07/20 0812  AST 30  --   ALT 10  --   ALKPHOS 81  --   BILITOT 0.9  --   PROT 7.0  --   ALBUMIN 3.9 3.7   Recent Labs  Lab 03/06/20 1720  LIPASE 23   CBC: Recent Labs  Lab 03/06/20 1720 03/06/20 2200 03/09/20 0328  WBC 8.2 7.8 8.8  NEUTROABS 6.5  --   --   HGB 11.3* 9.9* 9.7*  HCT 30.1* 26.9* 26.4*  MCV 73.6* 73.9* 75.9*  PLT 258 237 226   Blood Culture No results found for: SDES, SPECREQUEST, CULT, REPTSTATUS  Cardiac Enzymes: No results for input(s): CKTOTAL, CKMB, CKMBINDEX, TROPONINI in the last 168  hours. CBG: Recent Labs  Lab 03/07/20 0435  GLUCAP 121*   Iron Studies: No results for input(s): IRON, TIBC, TRANSFERRIN, FERRITIN in the last 72 hours. @lablastinr3 @ Studies/Results: DG Chest Port 1 View  Result Date: 03/08/2020 CLINICAL DATA:  Respiratory distress EXAM: PORTABLE CHEST 1 VIEW COMPARISON:  03/06/2020 FINDINGS: The heart size and mediastinal contours are within normal limits. Both lungs are clear. The visualized skeletal structures are unremarkable. IMPRESSION: No active disease. Electronically Signed   By: Fidela Salisbury MD   On: 03/08/2020 05:49   Medications: .  sodium chloride 50 mL/hr at 03/09/20 0926  . famotidine (PEPCID) IV 20 mg (03/09/20 0927)  . potassium PHOSPHATE IVPB (in mmol) Stopped (03/08/20 1404)   . Chlorhexidine Gluconate Cloth  6 each Topical Q0600  . heparin  5,000 Units Subcutaneous Q8H  . potassium chloride  40 mEq Oral BID  . sodium chloride flush  10-40 mL Intracatheter Q12H     Assessment/Plan:  1.  Hypotonic hyponatremia:  Serum sodium initially 107, K 2.9 with Urine osms 82, urine Na < 10, Serum osms are 227 consistent with low solute intake.  After solute repletion with 0.9% saline and KCl her serum sodium overcorrected and she required hypotonic fluids to bring back down for safe correction. Then went on to require 3% yesterday. 3% titrated down overnight due to appropriate correction - was 114 this AM and switched to 0.9% which was reduced for 0740 sodium 117.  1024 Sodium 122 - d/c'd NS and gave D5w 528mL bolus -- cont q2h sodiums for now --goal will will be maintain low 120s through tonight --Cont to hold HCTZ and losartan  -- hold meloxicam  2.  Hypokalemia: - likely d/t HCTZ - Repled  3.  HTN: - acceptably controlled at present - if agent is needed, would start amlodipine   Page with new issues.   Jannifer Hick MD 03/09/2020, 11:39 AM  [facility] Pager: [phone]

## 2020-03-09 NOTE — Progress Notes (Signed)
   Ref. Range 03/09/2020 22:25  Sodium Latest Ref Range: 135 - 145 mmol/L 121 (L)   D5W maintained @300mL /hr per nursing care order. Will draw next Sodium @~0030.

## 2020-03-09 NOTE — Progress Notes (Signed)
Just spoke with RN Elzie Rings, relayed latest sodium level= 112, and 12 lead ECG was already done, also informed that already spoke with nephrologist and relayed the sodium level and advised to continue sodium monitoring

## 2020-03-09 NOTE — Progress Notes (Signed)
Nephrology dr called up advise to decrease IV infusion of 3% sodium chloride to 20 ml/hr-done

## 2020-03-09 NOTE — Progress Notes (Signed)
[facility] PROGRESS NOTE    Progress Note  Dawn [name]  [id number] DOB: [birth year] DOA: 03/06/2020 PCP: Libby Maw, MD     Brief Narrative:   Dawn [name] is an 65 y.o. female [place] former smoker admitted on 03/06/2020 with complaint of numbness tingling and blurry vision upon arrival to the ED she started having emesis.  According to patient daughter she was started on recently on medications (losartan hydrochlorothiazide gabapentin and meloxicam) subsequently she started developing the symptoms in the ER she was found to have a sodium of 107 CT scan of the head was negative for acute intracranial processes neurology was consulted recommended to treat sodium with 3% saline.  Assessment/Plan:   Acute hypotonic hyponatremia: Sodium on admission was 107 (corrected 128), she was given DDAVP and 5% in water, is thought to be secondary to hydrochlorothiazide. Nephrology was consulted.  Further management per nephrology. Her last sodium earlier this morning was 114 we will continue sodium check every 2 hours, check a basic metabolic panel to monitor her potassium. Relates her nausea is better, she had 2 episodes of vomiting overnight.  We will continue liquid diet.  Severe hypokalemia: After repletion 3.5 we will continue oral repletion awaiting basic metabolic panel continue potassium orally.  Essential hypertension: Continue to hold hydrochlorothiazide, losartan has been resumed. Her blood pressure is relatively well controlled continue current regimen.  Chronic pain anesthetic: Continue pain medication.Marland Kitchen Avoid sedatives.   DVT prophylaxis: Lovenox Family Communication: Daughter Status is: Inpatient  Remains inpatient appropriate because:Hemodynamically unstable   Dispo: The patient is from: Home              Anticipated d/c is to: Home              Anticipated d/c date is: 3 days              Patient currently is not medically stable to d/c.   Difficult to  place patient No        Code Status:     Code Status Orders  (From admission, onward)         Start     Ordered   03/06/20 2045  Full code  Continuous        03/06/20 2052        Code Status History    This patient has a current code status but no historical code status.   Advance Care Planning Activity        IV Access:    Peripheral IV   Procedures and diagnostic studies:   DG Chest Port 1 View  Result Date: 03/08/2020 CLINICAL DATA:  Respiratory distress EXAM: PORTABLE CHEST 1 VIEW COMPARISON:  03/06/2020 FINDINGS: The heart size and mediastinal contours are within normal limits. Both lungs are clear. The visualized skeletal structures are unremarkable. IMPRESSION: No active disease. Electronically Signed   By: Fidela Salisbury MD   On: 03/08/2020 05:49     Medical Consultants:    None.  Anti-Infectives:   none  Subjective:    Dawn [name] relates her headaches are better, she had an episode of nausea and vomiting yesterday.  Objective:    Vitals:   03/09/20 0352 03/09/20 0400 03/09/20 0500 03/09/20 0600  BP:  140/61 125/62 (!) 122/51  Pulse:  65 64 62  Resp:  14 14 14   Temp: 98.7 F (37.1 C) 98.8 F (37.1 C)    TempSrc: Oral Oral    SpO2:  98% 98% 98%  Weight:  51.7 kg   Height:       SpO2: 98 %   Intake/Output Summary (Last 24 hours) at 03/09/2020 0713 Last data filed at 03/09/2020 0600 Gross per 24 hour  Intake 1406.17 ml  Output 2975 ml  Net -1568.83 ml   Filed Weights   03/06/20 1124 03/07/20 0500 03/09/20 0500  Weight: 53.5 kg 52.2 kg 51.7 kg    Exam: General exam: In no acute distress, relates her headache are significantly improved. Respiratory system: Good air movement and clear to auscultation. Cardiovascular system: S1 & S2 heard, RRR. No JVD. Gastrointestinal system: Abdomen is nondistended, soft and nontender.  Extremities: No pedal edema. Skin: No rashes, lesions or ulcers    Data Reviewed:    Labs: Basic  Metabolic Panel: Recent Labs  Lab 03/06/20 1115 03/06/20 1328 03/06/20 1618 03/06/20 1721 03/06/20 2200 03/07/20 0237 03/07/20 0812 03/07/20 1127 03/08/20 0330 03/08/20 0530 03/08/20 1302 03/08/20 1611 03/08/20 1945 03/08/20 2118 03/08/20 2304 03/09/20 0106 03/09/20 0328  NA QUESTIONABLE RESULTS, RECOMMEND RECOLLECT TO VERIFY 108*   < > 107* 114* 121* 128*  126*   < > 111*   < > 108*   < > 109* 110* 112* 112* 114*  K QUESTIONABLE RESULTS, RECOMMEND RECOLLECT TO VERIFY 3.0*   < > 2.9*  --   --  2.9*   < > 3.3*  --  3.5  --   --   --   --   --   --   CL QUESTIONABLE RESULTS, RECOMMEND RECOLLECT TO VERIFY <65*  --  67*  --   --  88*  --  78*  --   --   --   --   --   --   --   --   CO2 23 28  --  20*  --   --  27  --  25  --   --   --   --   --   --   --   --   GLUCOSE 223* 181*  --  178*  --   --  114*  --  112*  --   --   --   --   --   --   --   --   BUN 5* <5*  --  5*  --   --  6*  --  <5*  --   --   --   --   --   --   --   --   CREATININE 0.92 0.87  --  0.86  --   --  0.87  --  0.69  --   --   --   --   --   --   --   --   CALCIUM 9.0 9.2  --  9.1  --   --  9.0  --  8.0*  --   --   --   --   --   --   --   --   MG  --   --   --   --  1.8 2.0  --   --  1.6*  --   --   --   --   --   --   --   --   PHOS  --   --   --   --   --  3.2 3.2  --  2.4*  --   --   --   --   --   --   --   --    < > =  values in this interval not displayed.   GFR Estimated Creatinine Clearance: 54.3 mL/min (by C-G formula based on SCr of 0.69 mg/dL). Liver Function Tests: Recent Labs  Lab 03/06/20 1721 03/07/20 0812  AST 30  --   ALT 10  --   ALKPHOS 81  --   BILITOT 0.9  --   PROT 7.0  --   ALBUMIN 3.9 3.7   Recent Labs  Lab 03/06/20 1720  LIPASE 23   No results for input(s): AMMONIA in the last 168 hours. Coagulation profile No results for input(s): INR, PROTIME in the last 168 hours. COVID-19 Labs  No results for input(s): DDIMER, FERRITIN, LDH, CRP in the last 72 hours.  Lab  Results  Component Value Date   SARSCOV2NAA NEGATIVE 03/06/2020   SARSCOV2NAA NEGATIVE 07/28/2019   SARSCOV2NAA RESULT: NEGATIVE 06/24/2019    CBC: Recent Labs  Lab 03/06/20 1618 03/06/20 1720 03/06/20 2200 03/09/20 0328  WBC  --  8.2 7.8 8.8  NEUTROABS  --  6.5  --   --   HGB 12.6 11.3* 9.9* 9.7*  HCT 37.0 30.1* 26.9* 26.4*  MCV  --  73.6* 73.9* 75.9*  PLT  --  258 237 226   Cardiac Enzymes: No results for input(s): CKTOTAL, CKMB, CKMBINDEX, TROPONINI in the last 168 hours. BNP (last 3 results) No results for input(s): PROBNP in the last 8760 hours. CBG: Recent Labs  Lab 03/07/20 0435  GLUCAP 121*   D-Dimer: No results for input(s): DDIMER in the last 72 hours. Hgb A1c: Recent Labs    03/06/20 2200  HGBA1C 4.6*   Lipid Profile: No results for input(s): CHOL, HDL, LDLCALC, TRIG, CHOLHDL, LDLDIRECT in the last 72 hours. Thyroid function studies: Recent Labs    03/06/20 1715  TSH 0.513   Anemia work up: No results for input(s): VITAMINB12, FOLATE, FERRITIN, TIBC, IRON, RETICCTPCT in the last 72 hours. Sepsis Labs: Recent Labs  Lab 03/06/20 1720 03/06/20 2200 03/09/20 0328  WBC 8.2 7.8 8.8   Microbiology Recent Results (from the past 240 hour(s))  SARS Coronavirus 2 by RT PCR (hospital order, performed in [facility] hospital lab) Nasopharyngeal Nasopharyngeal Swab     Status: None   Collection Time: 03/06/20  7:05 PM   Specimen: Nasopharyngeal Swab  Result Value Ref Range Status   SARS Coronavirus 2 NEGATIVE NEGATIVE Final    Comment: (NOTE) SARS-CoV-2 target nucleic acids are NOT DETECTED.  The SARS-CoV-2 RNA is generally detectable in upper and lower respiratory specimens during the acute phase of infection. The lowest concentration of SARS-CoV-2 viral copies this assay can detect is 250 copies / mL. A negative result does not preclude SARS-CoV-2 infection and should not be used as the sole basis for treatment or other patient management  decisions.  A negative result may occur with improper specimen collection / handling, submission of specimen other than nasopharyngeal swab, presence of viral mutation(s) within the areas targeted by this assay, and inadequate number of viral copies (<250 copies / mL). A negative result must be combined with clinical observations, patient history, and epidemiological information.  Fact Sheet for Patients:   StrictlyIdeas.no  Fact Sheet for Healthcare Providers: BankingDealers.co.za  This test is not yet approved or  cleared by the [place] FDA and has been authorized for detection and/or diagnosis of SARS-CoV-2 by FDA under an Emergency Use Authorization (EUA).  This EUA will remain in effect (meaning this test can be used) for the duration of the COVID-19 declaration under Section  564(b)(1) of the Act, 21 U.S.C. section 360bbb-3(b)(1), unless the authorization is terminated or revoked sooner.  Performed at [facility], [place] [address] [place], [place] [address]   MRSA PCR Screening     Status: None   Collection Time: 03/07/20 12:45 AM   Specimen: Nasal Mucosa; Nasopharyngeal  Result Value Ref Range Status   MRSA by PCR NEGATIVE NEGATIVE Final    Comment:        The GeneXpert MRSA Assay (FDA approved for NASAL specimens only), is one component of a comprehensive MRSA colonization surveillance program. It is not intended to diagnose MRSA infection nor to guide or monitor treatment for MRSA infections. Performed at [facility], [place] [address], [place], [place] [address]      Medications:   . Chlorhexidine Gluconate Cloth  6 each Topical Q0600  . heparin  5,000 Units Subcutaneous Q8H  . sodium chloride flush  10-40 mL Intracatheter Q12H   Continuous Infusions: . sodium chloride    . famotidine (PEPCID) IV 20 mg (03/08/20 2116)  . potassium PHOSPHATE IVPB (in mmol) Stopped (03/08/20 1404)      LOS: 3  days   Charlynne Cousins  [facility]  03/09/2020, 7:13 AM

## 2020-03-09 NOTE — Progress Notes (Signed)
Pt vomited with yellow watery output approximated 50 ml, medicated with zofran IV as ordered

## 2020-03-09 NOTE — Progress Notes (Signed)
Pt arrived to 4e to from 36m. Pt oriented to room and staff. Vitals obtained. Telemetry applied and CCMD notified x2 verifiers. Warm blankets provided, per request. Purewick placed. Will continue current plan of care.

## 2020-03-10 LAB — SODIUM
Sodium: 118 mmol/L — CL (ref 135–145)
Sodium: 117 mmol/L — CL (ref 135–145)
Sodium: 117 mmol/L — CL (ref 135–145)
Sodium: 117 mmol/L — CL (ref 135–145)
Sodium: 119 mmol/L — CL (ref 135–145)
Sodium: 117 mmol/L — CL (ref 135–145)
Sodium: 118 mmol/L — CL (ref 135–145)
Sodium: 118 mmol/L — CL (ref 135–145)
Sodium: 120 mmol/L — ABNORMAL LOW (ref 135–145)
Sodium: 120 mmol/L — ABNORMAL LOW (ref 135–145)
Sodium: 121 mmol/L — ABNORMAL LOW (ref 135–145)

## 2020-03-10 MED ORDER — SODIUM CHLORIDE 3 % IV SOLN
INTRAVENOUS | Status: DC
  Filled 2020-03-10 (×2): qty 500

## 2020-03-10 NOTE — Progress Notes (Signed)
[facility] Progress Note   Subjective:   Seen in room, HA improved from previously.  No new issues.    Discussed with daughter on phone who served as interpreter.   Objective Vitals:   03/10/20 0645 03/10/20 0748 03/10/20 0856 03/10/20 1057  BP:  131/70 118/67 118/66  Pulse:  78 71 67  Resp:  15 16 (!) 23  Temp:  99 F (37.2 C) 98.8 F (37.1 C) 98.9 F (37.2 C)  TempSrc:  Oral Oral Oral  SpO2:  100% 98% 99%  Weight: 52.2 kg     Height:       Physical Exam General: moving about bed easily, appears comfortable. Heart:RRR Lungs: clear Abdomen:soft Extremities:no edema Neuro:  Nonfocal, talking, moving 4 extremities without issue  Additional Objective Labs: Basic Metabolic Panel: Recent Labs  Lab 03/07/20 0237 03/07/20 0812 03/07/20 1127 03/08/20 0330 03/08/20 0530 03/08/20 1302 03/08/20 1611 03/09/20 0545 03/09/20 0740 03/09/20 1024 03/10/20 1020 03/10/20 1140 03/10/20 1240  NA 121* 128*  126*   < > 111*   < > 108*   < > 115*  117* 117*   < > 119* 117* 118*  K  --  2.9*   < > 3.3*  --  3.5  --  3.2* 3.7  --   --   --   --   CL  --  88*  --  78*  --   --   --  84* 86*  --   --   --   --   CO2  --  27  --  25  --   --   --  21* 23  --   --   --   --   GLUCOSE  --  114*  --  112*  --   --   --  86 91  --   --   --   --   BUN  --  6*  --  <5*  --   --   --  <5* 5*  --   --   --   --   CREATININE  --  0.87  --  0.69  --   --   --  0.68 0.82  --   --   --   --   CALCIUM  --  9.0  --  8.0*  --   --   --  8.3* 8.5*  --   --   --   --   PHOS 3.2 3.2  --  2.4*  --   --   --   --   --   --   --   --   --    < > = values in this interval not displayed.   Liver Function Tests: Recent Labs  Lab 03/06/20 1721 03/07/20 0812  AST 30  --   ALT 10  --   ALKPHOS 81  --   BILITOT 0.9  --   PROT 7.0  --   ALBUMIN 3.9 3.7   Recent Labs  Lab 03/06/20 1720  LIPASE 23   CBC: Recent Labs  Lab 03/06/20 1720 03/06/20 2200 03/09/20 0328  WBC 8.2 7.8  8.8  NEUTROABS 6.5  --   --   HGB 11.3* 9.9* 9.7*  HCT 30.1* 26.9* 26.4*  MCV 73.6* 73.9* 75.9*  PLT 258 237 226   Blood Culture No results found for: SDES, SPECREQUEST, CULT, REPTSTATUS  Cardiac Enzymes: No results for  input(s): CKTOTAL, CKMB, CKMBINDEX, TROPONINI in the last 168 hours. CBG: Recent Labs  Lab 03/07/20 0435  GLUCAP 121*   Iron Studies: No results for input(s): IRON, TIBC, TRANSFERRIN, FERRITIN in the last 72 hours. @lablastinr3 @ Studies/Results: No results found. Medications: . famotidine (PEPCID) IV 20 mg (03/10/20 0933)  . potassium PHOSPHATE IVPB (in mmol) Stopped (03/08/20 1404)   . heparin  5,000 Units Subcutaneous Q8H  . sodium chloride flush  10-40 mL Intracatheter Q12H     Assessment/Plan:  1.  Hypotonic hyponatremia:  Serum sodium initially 107, K 2.9 with Urine osms 82, urine Na < 10, Serum osms are 227 consistent with low solute intake.  We've gone back and forth with hyper and hypotonic fluids and DDAVP to correct her at a safe rate.  Last DDAVP 50mcg was at 2020 yesterday and D5W has been off since 5am.  This AM her serum sodium has trended 117 > 119 > 117 > 118 -- cont q2h sodiums for now but soon should be able to switch to q4h I hope --If next sodium not > 120 will resume hypertonic --goal will be mid 120s today. --Cont to hold HCTZ and losartan  -- hold meloxicam  2.  Hypokalemia: - likely d/t HCTZ - Repled  3.  HTN: - acceptably controlled at present - if agent is needed, would start amlodipine   Page with new issues.   Jannifer Hick MD 03/10/2020, 2:02 PM  [facility] Pager: [phone]

## 2020-03-10 NOTE — Progress Notes (Signed)
   Ref. Range 03/10/2020 02:53  Sodium Latest Ref Range: 135 - 145 mmol/L 117 (LL)   D5W infusion stopped per nursing care order. Will draw next Sodium @~0445.

## 2020-03-10 NOTE — Progress Notes (Signed)
   Ref. Range 03/10/2020 00:44  Sodium Latest Ref Range: 135 - 145 mmol/L 118 (LL)   D5W reduced to 119mL/hr per nursing care order. Will draw next Sodium @~0245.

## 2020-03-10 NOTE — Progress Notes (Signed)
   Ref. Range 03/10/2020 05:01  Sodium Latest Ref Range: 135 - 145 mmol/L 117 (LL)   D5W infusion remains off. Will continue to monitor and draw next Sodium [phone]

## 2020-03-10 NOTE — Progress Notes (Addendum)
CRITICAL VALUE ALERT  Critical Value:  Sodium=118  Date & Time Notied: 03/10/20 at 1537  Provider Notified: Johnney Ou   Orders Received/Actions taken: sodium chloride 3%

## 2020-03-10 NOTE — Progress Notes (Signed)
[facility] PROGRESS NOTE    Progress Note  Dawn [name]  [id number] DOB: July 12, 1955 DOA: 03/06/2020 PCP: Libby Maw, MD     Brief Narrative:   Dawn [name] is an 65 y.o. female [place] former smoker admitted on 03/06/2020 with complaint of numbness tingling and blurry vision upon arrival to the ED she started having emesis.  According to patient daughter she was started on recently on medications (losartan hydrochlorothiazide gabapentin and meloxicam) subsequently she started developing the symptoms in the ER she was found to have a sodium of 107 CT scan of the head was negative for acute intracranial processes neurology was consulted recommended to treat sodium with 3% saline.  Assessment/Plan:   Acute hypotonic hyponatremia: His sodium continues to improve slowly she is currently in asymptomatic overnight corrected to fasting 124 renal give her DDAVP and D5W with a repeated sodium came down to 117. So DDAVP and D5W were stopped. We will continue to check sodium every 2 hours further management per renal.  Severe hypokalemia: After repletion 3.5 we will continue oral repletion awaiting basic metabolic panel continue potassium orally.  Essential hypertension: Continue to hold hydrochlorothiazide, losartan has been resumed. Her blood pressure is relatively well controlled continue current regimen.  Chronic pain anesthetic: Continue pain medication.Marland Kitchen Avoid sedatives.   DVT prophylaxis: Lovenox Family Communication: Daughter Status is: Inpatient  Remains inpatient appropriate because:Hemodynamically unstable   Dispo: The patient is from: Home              Anticipated d/c is to: Home              Anticipated d/c date is: 3 days              Patient currently is not medically stable to d/c.   Difficult to place patient No        Code Status:     Code Status Orders  (From admission, onward)         Start     Ordered   03/06/20 2045  Full code   Continuous        03/06/20 2052        Code Status History    This patient has a current code status but no historical code status.   Advance Care Planning Activity        IV Access:    Peripheral IV   Procedures and diagnostic studies:   No results found.   Medical Consultants:    None.  Anti-Infectives:   none  Subjective:    Dawn [name] she relates her headache this not better this morning.  Objective:    Vitals:   03/10/20 0250 03/10/20 0645 03/10/20 0748 03/10/20 0856  BP: 120/66  131/70   Pulse: 75  78   Resp: 12  15   Temp: 98.8 F (37.1 C)  99 F (37.2 C) 98.8 F (37.1 C)  TempSrc: Oral  Oral Oral  SpO2: 100%  100%   Weight:  52.2 kg    Height:       SpO2: 100 %   Intake/Output Summary (Last 24 hours) at 03/10/2020 0909 Last data filed at 03/10/2020 0707 Gross per 24 hour  Intake 4392.93 ml  Output 4475 ml  Net -82.07 ml   Filed Weights   03/07/20 0500 03/09/20 0500 03/10/20 0645  Weight: 52.2 kg 51.7 kg 52.2 kg    Exam: General exam: In no acute distress. Respiratory system: Good air movement and clear to auscultation. Cardiovascular  system: S1 & S2 heard, RRR. No JVD. Gastrointestinal system: Abdomen is nondistended, soft and nontender.  Extremities: No pedal edema. Skin: No rashes, lesions or ulcers Psychiatry: Judgement and insight appear normal. Mood & affect appropriate. Data Reviewed:    Labs: Basic Metabolic Panel: Recent Labs  Lab 03/06/20 1721 03/06/20 2200 03/07/20 0237 03/07/20 1093 03/07/20 1127 03/08/20 0330 03/08/20 0530 03/09/20 0545 03/09/20 0740 03/09/20 1024 03/09/20 2225 03/10/20 0044 03/10/20 0253 03/10/20 0501 03/10/20 0654  NA 107* 114* 121* 128*  126*   < > 111*   < > 115*  117* 117*   < > 121* 118* 117* 117* 117*  K 2.9*  --   --  2.9*   < > 3.3*   < > 3.2* 3.7  --   --   --   --   --   --   CL 67*  --   --  88*  --  78*  --  84* 86*  --   --   --   --   --   --   CO2 20*  --   --  27   --  25  --  21* 23  --   --   --   --   --   --   GLUCOSE 178*  --   --  114*  --  112*  --  86 91  --   --   --   --   --   --   BUN 5*  --   --  6*  --  <5*  --  <5* 5*  --   --   --   --   --   --   CREATININE 0.86  --   --  0.87  --  0.69  --  0.68 0.82  --   --   --   --   --   --   CALCIUM 9.1  --   --  9.0  --  8.0*  --  8.3* 8.5*  --   --   --   --   --   --   MG  --  1.8 2.0  --   --  1.6*  --   --   --   --   --   --   --   --   --   PHOS  --   --  3.2 3.2  --  2.4*  --   --   --   --   --   --   --   --   --    < > = values in this interval not displayed.   GFR Estimated Creatinine Clearance: 53 mL/min (by C-G formula based on SCr of 0.82 mg/dL). Liver Function Tests: Recent Labs  Lab 03/06/20 1721 03/07/20 0812  AST 30  --   ALT 10  --   ALKPHOS 81  --   BILITOT 0.9  --   PROT 7.0  --   ALBUMIN 3.9 3.7   Recent Labs  Lab 03/06/20 1720  LIPASE 23   No results for input(s): AMMONIA in the last 168 hours. Coagulation profile No results for input(s): INR, PROTIME in the last 168 hours. COVID-19 Labs  No results for input(s): DDIMER, FERRITIN, LDH, CRP in the last 72 hours.  Lab Results  Component Value Date   SARSCOV2NAA NEGATIVE 03/06/2020   [place] NEGATIVE 07/28/2019   SARSCOV2NAA RESULT:  NEGATIVE 06/24/2019    CBC: Recent Labs  Lab 03/06/20 1618 03/06/20 1720 03/06/20 2200 03/09/20 0328  WBC  --  8.2 7.8 8.8  NEUTROABS  --  6.5  --   --   HGB 12.6 11.3* 9.9* 9.7*  HCT 37.0 30.1* 26.9* 26.4*  MCV  --  73.6* 73.9* 75.9*  PLT  --  258 237 226   Cardiac Enzymes: No results for input(s): CKTOTAL, CKMB, CKMBINDEX, TROPONINI in the last 168 hours. BNP (last 3 results) No results for input(s): PROBNP in the last 8760 hours. CBG: Recent Labs  Lab 03/07/20 0435  GLUCAP 121*   D-Dimer: No results for input(s): DDIMER in the last 72 hours. Hgb A1c: No results for input(s): HGBA1C in the last 72 hours. Lipid Profile: No results for input(s):  CHOL, HDL, LDLCALC, TRIG, CHOLHDL, LDLDIRECT in the last 72 hours. Thyroid function studies: No results for input(s): TSH, T4TOTAL, T3FREE, THYROIDAB in the last 72 hours.  Invalid input(s): FREET3 Anemia work up: No results for input(s): VITAMINB12, FOLATE, FERRITIN, TIBC, IRON, RETICCTPCT in the last 72 hours. Sepsis Labs: Recent Labs  Lab 03/06/20 1720 03/06/20 2200 03/09/20 0328  WBC 8.2 7.8 8.8   Microbiology Recent Results (from the past 240 hour(s))  SARS Coronavirus 2 by RT PCR (hospital order, performed in [facility] hospital lab) Nasopharyngeal Nasopharyngeal Swab     Status: None   Collection Time: 03/06/20  7:05 PM   Specimen: Nasopharyngeal Swab  Result Value Ref Range Status   SARS Coronavirus 2 NEGATIVE NEGATIVE Final    Comment: (NOTE) SARS-CoV-2 target nucleic acids are NOT DETECTED.  The SARS-CoV-2 RNA is generally detectable in upper and lower respiratory specimens during the acute phase of infection. The lowest concentration of SARS-CoV-2 viral copies this assay can detect is 250 copies / mL. A negative result does not preclude SARS-CoV-2 infection and should not be used as the sole basis for treatment or other patient management decisions.  A negative result may occur with improper specimen collection / handling, submission of specimen other than nasopharyngeal swab, presence of viral mutation(s) within the areas targeted by this assay, and inadequate number of viral copies (<250 copies / mL). A negative result must be combined with clinical observations, patient history, and epidemiological information.  Fact Sheet for Patients:   StrictlyIdeas.no  Fact Sheet for Healthcare Providers: BankingDealers.co.za  This test is not yet approved or  cleared by the [place] FDA and has been authorized for detection and/or diagnosis of SARS-CoV-2 by FDA under an Emergency Use Authorization (EUA).  This EUA will  remain in effect (meaning this test can be used) for the duration of the COVID-19 declaration under Section 564(b)(1) of the Act, 21 U.S.C. section 360bbb-3(b)(1), unless the authorization is terminated or revoked sooner.  Performed at [facility], [place] [address] [place], [place] [address]   MRSA PCR Screening     Status: None   Collection Time: 03/07/20 12:45 AM   Specimen: Nasal Mucosa; Nasopharyngeal  Result Value Ref Range Status   MRSA by PCR NEGATIVE NEGATIVE Final    Comment:        The GeneXpert MRSA Assay (FDA approved for NASAL specimens only), is one component of a comprehensive MRSA colonization surveillance program. It is not intended to diagnose MRSA infection nor to guide or monitor treatment for MRSA infections. Performed at [facility], [place] [address], [place],  [address]      Medications:   .  heparin  5,000 Units Subcutaneous Q8H  . sodium chloride flush  10-40 mL Intracatheter Q12H   Continuous Infusions: . famotidine (PEPCID) IV Stopped (03/09/20 2308)  . potassium PHOSPHATE IVPB (in mmol) Stopped (03/08/20 1404)      LOS: 4 days   Charlynne Cousins  [facility]  03/10/2020, 9:09 AM

## 2020-03-11 LAB — SODIUM
Sodium: 124 mmol/L — ABNORMAL LOW (ref 135–145)
Sodium: 127 mmol/L — ABNORMAL LOW (ref 135–145)
Sodium: 129 mmol/L — ABNORMAL LOW (ref 135–145)
Sodium: 129 mmol/L — ABNORMAL LOW (ref 135–145)
Sodium: 132 mmol/L — ABNORMAL LOW (ref 135–145)
Sodium: 133 mmol/L — ABNORMAL LOW (ref 135–145)
Sodium: 133 mmol/L — ABNORMAL LOW (ref 135–145)
Sodium: 135 mmol/L (ref 135–145)

## 2020-03-11 LAB — BASIC METABOLIC PANEL
Anion gap: 10 (ref 5–15)
BUN: 5 mg/dL — ABNORMAL LOW (ref 8–23)
CO2: 24 mmol/L (ref 22–32)
Calcium: 8.9 mg/dL (ref 8.9–10.3)
Chloride: 97 mmol/L — ABNORMAL LOW (ref 98–111)
Creatinine, Ser: 0.87 mg/dL (ref 0.44–1.00)
GFR, Estimated: >60 mL/min (ref >60)
Glucose, Bld: 111 mg/dL — ABNORMAL HIGH (ref 70–99)
Potassium: 4.3 mmol/L (ref 3.5–5.1)
Sodium: 131 mmol/L — ABNORMAL LOW (ref 135–145)

## 2020-03-11 MED ORDER — DEXTROSE 5 % IV BOLUS
500.0000 mL | Freq: Once | INTRAVENOUS | Status: AC
  Administered 2020-03-11: 500 mL via INTRAVENOUS

## 2020-03-11 MED ORDER — FAMOTIDINE 20 MG PO TABS
20.0000 mg | ORAL_TABLET | Freq: Two times a day (BID) | ORAL | Status: DC
  Administered 2020-03-11 – 2020-03-13 (×5): 20 mg via ORAL
  Filled 2020-03-11 (×5): qty 1

## 2020-03-11 MED ORDER — POLYETHYLENE GLYCOL 3350 17 G PO PACK
17.0000 g | PACK | Freq: Two times a day (BID) | ORAL | Status: DC
  Administered 2020-03-11 – 2020-03-13 (×4): 17 g via ORAL
  Filled 2020-03-11 (×5): qty 1

## 2020-03-11 MED ORDER — DEXTROSE 5 % IV SOLN: INTRAVENOUS | Status: DC

## 2020-03-11 NOTE — Progress Notes (Signed)
[facility] Progress Note   Subjective:   Seen in room, HA improved from previously.  No new issues.    Objective Vitals:   03/10/20 1942 03/10/20 2343 03/11/20 0347 03/11/20 0750  BP: 119/67 (!) 109/57 (!) 110/94 (!) 99/57  Pulse: 65 60 63 64  Resp: 20 14 14 16   Temp: 98.9 F (37.2 C) 97.9 F (36.6 C) 98.4 F (36.9 C) 98.6 F (37 C)  TempSrc: Oral Oral Oral Oral  SpO2: 97% 97% 99% 98%  Weight:   53.2 kg   Height:       Physical Exam General: moving about bed easily getting a bath, appears much more comfortable. Heart:RRR Lungs: clear Abdomen:soft Extremities:no edema Neuro:  Nonfocal, talking, moving 4 extremities without issue  Additional Objective Labs: Basic Metabolic Panel: Recent Labs  Lab 03/07/20 0237 03/07/20 0812 03/07/20 1127 03/08/20 0330 03/08/20 0530 03/08/20 1302 03/08/20 1611 03/09/20 0545 03/09/20 0740 03/09/20 1024 03/11/20 0348 03/11/20 0621 03/11/20 0801  NA 121* 128*  126*   < > 111*   < > 108*   < > 115*  117* 117*   < > 132* 129* 129*  K  --  2.9*   < > 3.3*  --  3.5  --  3.2* 3.7  --   --   --   --   CL  --  88*  --  78*  --   --   --  84* 86*  --   --   --   --   CO2  --  27  --  25  --   --   --  21* 23  --   --   --   --   GLUCOSE  --  114*  --  112*  --   --   --  86 91  --   --   --   --   BUN  --  6*  --  <5*  --   --   --  <5* 5*  --   --   --   --   CREATININE  --  0.87  --  0.69  --   --   --  0.68 0.82  --   --   --   --   CALCIUM  --  9.0  --  8.0*  --   --   --  8.3* 8.5*  --   --   --   --   PHOS 3.2 3.2  --  2.4*  --   --   --   --   --   --   --   --   --    < > = values in this interval not displayed.   Liver Function Tests: Recent Labs  Lab 03/06/20 1721 03/07/20 0812  AST 30  --   ALT 10  --   ALKPHOS 81  --   BILITOT 0.9  --   PROT 7.0  --   ALBUMIN 3.9 3.7   Recent Labs  Lab 03/06/20 1720  LIPASE 23   CBC: Recent Labs  Lab 03/06/20 1720 03/06/20 2200 03/09/20 0328  WBC 8.2  7.8 8.8  NEUTROABS 6.5  --   --   HGB 11.3* 9.9* 9.7*  HCT 30.1* 26.9* 26.4*  MCV 73.6* 73.9* 75.9*  PLT 258 237 226   Blood Culture No results found for: SDES, SPECREQUEST, CULT, REPTSTATUS  Cardiac Enzymes: No results for input(s):  CKTOTAL, CKMB, CKMBINDEX, TROPONINI in the last 168 hours. CBG: Recent Labs  Lab 03/07/20 0435  GLUCAP 121*   Iron Studies: No results for input(s): IRON, TIBC, TRANSFERRIN, FERRITIN in the last 72 hours. @lablastinr3 @ Studies/Results: No results found. Medications: . dextrose 25 mL/hr at 03/11/20 1021   . famotidine  20 mg Oral BID  . heparin  5,000 Units Subcutaneous Q8H  . polyethylene glycol  17 g Oral BID  . sodium chloride flush  10-40 mL Intracatheter Q12H     Assessment/Plan:  1.  Hypotonic hyponatremia:  Serum sodium initially 107, K 2.9 with Urine osms 82, urine Na < 10, Serum osms are 227 consistent with low solute intake.  We've gone back and forth with hyper and hypotonic fluids and DDAVP to correct her at a safe rate. She had hypertonic until early this AM when sodium rose a bit quickly so it was stopped and she's on low rate D5W for now -- ok to switch to q4h sodiums now --At this point as long as next sodium in 120s I plan to d/c IVF and let her continue to drift up --Cont to hold HCTZ and losartan  -- hold meloxicam  2.  Hypokalemia: - likely d/t HCTZ - Repled - recheck pending for this AM  3.  HTN: - acceptably controlled at present/low - if agent is needed, would start amlodipine   Page with new issues.   Jannifer Hick MD 03/11/2020, 10:24 AM  [facility] Pager: [phone]

## 2020-03-11 NOTE — Progress Notes (Signed)
Sodium increased to 127, paged Dr. Zelphia Cairo to notify just minutes prior to my text 3 % hyper tonic saline decreased to 30/ml

## 2020-03-11 NOTE — Progress Notes (Signed)
[facility] PROGRESS NOTE    Progress Note  Dawn [name]  [id number] DOB: 05/29/1955 DOA: 03/06/2020 PCP: Libby Maw, MD     Brief Narrative:   Dawn [name] is an 65 y.o. female [place] former smoker admitted on 03/06/2020 with complaint of numbness tingling and blurry vision upon arrival to the ED she started having emesis.  According to patient daughter she was started on recently on medications (losartan hydrochlorothiazide gabapentin and meloxicam) subsequently she started developing the symptoms in the ER she was found to have a sodium of 107 CT scan of the head was negative for acute intracranial processes neurology was consulted recommended to treat sodium with 3% saline.  Assessment/Plan:   Acute hypotonic hyponatremia: Sodium this morning 129, hypertonic saline was stopped she was started on D5W. Has stabilized at 129 Further management per renal.  Severe hypokalemia: Replete orally basic metabolic panel is pending.  Essential hypertension: Continue losartan. She should go home off hydrochlorothiazide Her blood pressure is relatively well controlled continue current regimen.  Chronic pain anesthetic: Continue pain medication.Marland Kitchen Avoid sedatives.   DVT prophylaxis: Lovenox Family Communication: Daughter Status is: Inpatient  Remains inpatient appropriate because:Hemodynamically unstable   Dispo: The patient is from: Home              Anticipated d/c is to: Home              Anticipated d/c date is: 3 days              Patient currently is not medically stable to d/c.   Difficult to place patient No  Code Status:     Code Status Orders  (From admission, onward)         Start     Ordered   03/06/20 2045  Full code  Continuous        03/06/20 2052        Code Status History    This patient has a current code status but no historical code status.   Advance Care Planning Activity        IV Access:    Peripheral IV   Procedures  and diagnostic studies:   No results found.   Medical Consultants:    None.  Anti-Infectives:   none  Subjective:    Dawn [name] relates her headaches are resolved. Objective:    Vitals:   03/10/20 1942 03/10/20 2343 03/11/20 0347 03/11/20 0750  BP: 119/67 (!) 109/57 (!) 110/94 (!) 99/57  Pulse: 65 60 63 64  Resp: 20 14 14 16   Temp: 98.9 F (37.2 C) 97.9 F (36.6 C) 98.4 F (36.9 C) 98.6 F (37 C)  TempSrc: Oral Oral Oral Oral  SpO2: 97% 97% 99% 98%  Weight:   53.2 kg   Height:       SpO2: 98 %   Intake/Output Summary (Last 24 hours) at 03/11/2020 0923 Last data filed at 03/11/2020 0439 Gross per 24 hour  Intake 490.97 ml  Output 2200 ml  Net -1709.03 ml   Filed Weights   03/09/20 0500 03/10/20 0645 03/11/20 0347  Weight: 51.7 kg 52.2 kg 53.2 kg    Exam: General exam: In no acute distress. Respiratory system: Good air movement and clear to auscultation. Cardiovascular system: S1 & S2 heard, RRR. No JVD. Gastrointestinal system: Abdomen is nondistended, soft and nontender.  Extremities: No pedal edema. Skin: No rashes, lesions or ulcers Psychiatry: Judgement and insight appear normal. Mood & affect appropriate. Data Reviewed:  Labs: Basic Metabolic Panel: Recent Labs  Lab 03/06/20 1721 03/06/20 2200 03/07/20 0237 03/07/20 0812 03/07/20 1127 03/08/20 0330 03/08/20 0530 03/09/20 0545 03/09/20 0740 03/09/20 1024 03/10/20 2342 03/11/20 0155 03/11/20 0348 03/11/20 0621 03/11/20 0801  NA 107* 114* 121* 128*  126*   < > 111*   < > 115*  117* 117*   < > 124* 127* 132* 129* 129*  K 2.9*  --   --  2.9*   < > 3.3*   < > 3.2* 3.7  --   --   --   --   --   --   CL 67*  --   --  88*  --  78*  --  84* 86*  --   --   --   --   --   --   CO2 20*  --   --  27  --  25  --  21* 23  --   --   --   --   --   --   GLUCOSE 178*  --   --  114*  --  112*  --  86 91  --   --   --   --   --   --   BUN 5*  --   --  6*  --  <5*  --  <5* 5*  --   --   --   --   --    --   CREATININE 0.86  --   --  0.87  --  0.69  --  0.68 0.82  --   --   --   --   --   --   CALCIUM 9.1  --   --  9.0  --  8.0*  --  8.3* 8.5*  --   --   --   --   --   --   MG  --  1.8 2.0  --   --  1.6*  --   --   --   --   --   --   --   --   --   PHOS  --   --  3.2 3.2  --  2.4*  --   --   --   --   --   --   --   --   --    < > = values in this interval not displayed.   GFR Estimated Creatinine Clearance: 53 mL/min (by C-G formula based on SCr of 0.82 mg/dL). Liver Function Tests: Recent Labs  Lab 03/06/20 1721 03/07/20 0812  AST 30  --   ALT 10  --   ALKPHOS 81  --   BILITOT 0.9  --   PROT 7.0  --   ALBUMIN 3.9 3.7   Recent Labs  Lab 03/06/20 1720  LIPASE 23   No results for input(s): AMMONIA in the last 168 hours. Coagulation profile No results for input(s): INR, PROTIME in the last 168 hours. COVID-19 Labs  No results for input(s): DDIMER, FERRITIN, LDH, CRP in the last 72 hours.  Lab Results  Component Value Date   SARSCOV2NAA NEGATIVE 03/06/2020   SARSCOV2NAA NEGATIVE 07/28/2019   SARSCOV2NAA RESULT: NEGATIVE 06/24/2019    CBC: Recent Labs  Lab 03/06/20 1618 03/06/20 1720 03/06/20 2200 03/09/20 0328  WBC  --  8.2 7.8 8.8  NEUTROABS  --  6.5  --   --   HGB 12.6 11.3*  9.9* 9.7*  HCT 37.0 30.1* 26.9* 26.4*  MCV  --  73.6* 73.9* 75.9*  PLT  --  258 237 226   Cardiac Enzymes: No results for input(s): CKTOTAL, CKMB, CKMBINDEX, TROPONINI in the last 168 hours. BNP (last 3 results) No results for input(s): PROBNP in the last 8760 hours. CBG: Recent Labs  Lab 03/07/20 0435  GLUCAP 121*   D-Dimer: No results for input(s): DDIMER in the last 72 hours. Hgb A1c: No results for input(s): HGBA1C in the last 72 hours. Lipid Profile: No results for input(s): CHOL, HDL, LDLCALC, TRIG, CHOLHDL, LDLDIRECT in the last 72 hours. Thyroid function studies: No results for input(s): TSH, T4TOTAL, T3FREE, THYROIDAB in the last 72 hours.  Invalid input(s):  FREET3 Anemia work up: No results for input(s): VITAMINB12, FOLATE, FERRITIN, TIBC, IRON, RETICCTPCT in the last 72 hours. Sepsis Labs: Recent Labs  Lab 03/06/20 1720 03/06/20 2200 03/09/20 0328  WBC 8.2 7.8 8.8   Microbiology Recent Results (from the past 240 hour(s))  SARS Coronavirus 2 by RT PCR (hospital order, performed in [facility] hospital lab) Nasopharyngeal Nasopharyngeal Swab     Status: None   Collection Time: 03/06/20  7:05 PM   Specimen: Nasopharyngeal Swab  Result Value Ref Range Status   SARS Coronavirus 2 NEGATIVE NEGATIVE Final    Comment: (NOTE) SARS-CoV-2 target nucleic acids are NOT DETECTED.  The SARS-CoV-2 RNA is generally detectable in upper and lower respiratory specimens during the acute phase of infection. The lowest concentration of SARS-CoV-2 viral copies this assay can detect is 250 copies / mL. A negative result does not preclude SARS-CoV-2 infection and should not be used as the sole basis for treatment or other patient management decisions.  A negative result may occur with improper specimen collection / handling, submission of specimen other than nasopharyngeal swab, presence of viral mutation(s) within the areas targeted by this assay, and inadequate number of viral copies (<250 copies / mL). A negative result must be combined with clinical observations, patient history, and epidemiological information.  Fact Sheet for Patients:   StrictlyIdeas.no  Fact Sheet for Healthcare Providers: BankingDealers.co.za  This test is not yet approved or  cleared by the [place] FDA and has been authorized for detection and/or diagnosis of SARS-CoV-2 by FDA under an Emergency Use Authorization (EUA).  This EUA will remain in effect (meaning this test can be used) for the duration of the COVID-19 declaration under Section 564(b)(1) of the Act, 21 U.S.C. section 360bbb-3(b)(1), unless the authorization is  terminated or revoked sooner.  Performed at [facility], [place] [address] [place], [place] [address]   MRSA PCR Screening     Status: None   Collection Time: 03/07/20 12:45 AM   Specimen: Nasal Mucosa; Nasopharyngeal  Result Value Ref Range Status   MRSA by PCR NEGATIVE NEGATIVE Final    Comment:        The GeneXpert MRSA Assay (FDA approved for NASAL specimens only), is one component of a comprehensive MRSA colonization surveillance program. It is not intended to diagnose MRSA infection nor to guide or monitor treatment for MRSA infections. Performed at [facility], [place] [address], [place], [place] [address]      Medications:   . famotidine  20 mg Oral BID  . heparin  5,000 Units Subcutaneous Q8H  . sodium chloride flush  10-40 mL Intracatheter Q12H   Continuous Infusions: . dextrose 75 mL/hr at 03/11/20 0544      LOS: 5  days   Charlynne Cousins  [facility]  03/11/2020, 9:23 AM

## 2020-03-11 NOTE — Progress Notes (Addendum)
Sodium 132 paged Dr. Johnney Ou  who ordered d/c hypertonic saline gtt and start D5W gtt

## 2020-03-12 LAB — SODIUM
Sodium: 134 mmol/L — ABNORMAL LOW (ref 135–145)
Sodium: 135 mmol/L (ref 135–145)
Sodium: 132 mmol/L — ABNORMAL LOW (ref 135–145)

## 2020-03-12 NOTE — Progress Notes (Signed)
[facility] Progress Note   Subjective:   Seen in room, HA off and on.  Blurred vision off and on.  Daughter on phone says she has HA at home too.   No new issues.    Objective Vitals:   03/12/20 0010 03/12/20 0400 03/12/20 0800 03/12/20 1211  BP: 120/63 (!) 130/54 121/63 132/65  Pulse: (!) 55 (!) 55 72 95  Resp: 18 11 14 16   Temp: 98 F (36.7 C) 98.4 F (36.9 C) 98 F (36.7 C) 97.6 F (36.4 C)  TempSrc: Oral Oral Oral Oral  SpO2: 99% 98% 100% 100%  Weight:  53.2 kg    Height:       Physical Exam General: in bed; comfortable. Heart:RRR Lungs: clear Abdomen:soft Extremities:no edema Neuro:  Nonfocal, talking with normal speech, moving 4 extremities without issue  Additional Objective Labs: Basic Metabolic Panel: Recent Labs  Lab 03/07/20 0237 03/07/20 0812 03/07/20 1127 03/08/20 0330 03/08/20 0530 03/09/20 0545 03/09/20 0740 03/09/20 1024 03/11/20 1037 03/11/20 1437 03/12/20 0253 03/12/20 0641 03/12/20 1036  NA 121* 128*  126*   < > 111*   < > 115*  117* 117*   < > 131*   < > 134* 135 132*  K  --  2.9*   < > 3.3*   < > 3.2* 3.7  --  4.3  --   --   --   --   CL  --  88*  --  78*  --  84* 86*  --  97*  --   --   --   --   CO2  --  27  --  25  --  21* 23  --  24  --   --   --   --   GLUCOSE  --  114*  --  112*  --  86 91  --  111*  --   --   --   --   BUN  --  6*  --  <5*  --  <5* 5*  --  5*  --   --   --   --   CREATININE  --  0.87  --  0.69  --  0.68 0.82  --  0.87  --   --   --   --   CALCIUM  --  9.0  --  8.0*  --  8.3* 8.5*  --  8.9  --   --   --   --   PHOS 3.2 3.2  --  2.4*  --   --   --   --   --   --   --   --   --    < > = values in this interval not displayed.   Liver Function Tests: Recent Labs  Lab 03/06/20 1721 03/07/20 0812  AST 30  --   ALT 10  --   ALKPHOS 81  --   BILITOT 0.9  --   PROT 7.0  --   ALBUMIN 3.9 3.7   Recent Labs  Lab 03/06/20 1720  LIPASE 23   CBC: Recent Labs  Lab 03/06/20 1720 03/06/20 2200  03/09/20 0328  WBC 8.2 7.8 8.8  NEUTROABS 6.5  --   --   HGB 11.3* 9.9* 9.7*  HCT 30.1* 26.9* 26.4*  MCV 73.6* 73.9* 75.9*  PLT 258 237 226   Blood Culture No results found for: SDES, SPECREQUEST, CULT, REPTSTATUS  Cardiac Enzymes: No results  for input(s): CKTOTAL, CKMB, CKMBINDEX, TROPONINI in the last 168 hours. CBG: Recent Labs  Lab 03/07/20 0435  GLUCAP 121*   Iron Studies: No results for input(s): IRON, TIBC, TRANSFERRIN, FERRITIN in the last 72 hours. @lablastinr3 @ Studies/Results: No results found. Medications:  . famotidine  20 mg Oral BID  . heparin  5,000 Units Subcutaneous Q8H  . polyethylene glycol  17 g Oral BID  . sodium chloride flush  10-40 mL Intracatheter Q12H     Assessment/Plan:  1.  Hypotonic hyponatremia:  Serum sodium initially 107, K 2.9 with Urine osms 82, urine Na < 10, Serum osms are 227 consistent with low solute intake.  S/p lengthy correction requiring hypertonic and hypotonic fluids to correct at appropriate rate.  I think her HA is multifactorial and given normal neurologic exam I don't think repeat head imaging is neded.  --Serum sodium now into normal range --d/c q4h sodium checks --Encourage normal solute intake --Cont to hold HCTZ -- added thiazides to allergy list -- hold meloxicam  2.  Hypokalemia: - likely d/t HCTZ - resolved s/p repletion  3.  HTN: - acceptably controlled at present/low - if agent is needed, would start amlodipine   Long conversation with her daughter today summarizing.  She threw away the HCTZ. Will sign off - no nephrology f/u indicated.  Page with new issues.  Looks like PT to eval prior to discharge  Jannifer Hick MD 03/12/2020, 1:46 PM  [facility] Pager: [phone]

## 2020-03-12 NOTE — Progress Notes (Signed)
[facility] PROGRESS NOTE    Progress Note  Dawn [name]  NAT:[id number] DOB: Feb 20, 1955 DOA: 03/06/2020 PCP: Libby Maw, MD     Brief Narrative:   Dawn [name] is an 65 y.o. female [place] former smoker admitted on 03/06/2020 with complaint of numbness tingling and blurry vision upon arrival to the ED she started having emesis.  According to patient daughter she was started on recently on medications (losartan hydrochlorothiazide gabapentin and meloxicam) subsequently she started developing the symptoms in the ER she was found to have a sodium of 107 CT scan of the head was negative for acute intracranial processes neurology was consulted recommended to treat sodium with 3% saline.  Assessment/Plan:   Acute hypotonic hyponatremia: Sodium has improved, she was treated holding her hydrochlorothiazide, her sodium started to improve rapidly she was started on  DDAVP and D5W As an outpatient she will need to go off hydrochlorothiazide and meloxicam. We will go ahead and consult physical therapy.  Severe hypokalemia: Replete and awaiting now resolved.  Essential hypertension: Continue losartan. She should go home off hydrochlorothiazide Her blood pressure is relatively well controlled on current regimen.  Chronic pain anesthetic: Continue pain medication.Marland Kitchen Avoid sedatives.   DVT prophylaxis: Lovenox Family Communication: Daughter Status is: Inpatient  Remains inpatient appropriate because:Hemodynamically unstable   Dispo: The patient is from: Home              Anticipated d/c is to: Home              Anticipated d/c date is: 1 days              Patient currently is not medically stable to d/c.   Difficult to place patient No  Code Status:     Code Status Orders  (From admission, onward)         Start     Ordered   03/06/20 2045  Full code  Continuous        03/06/20 2052        Code Status History    This patient has a current code status but no  historical code status.   Advance Care Planning Activity        IV Access:    Peripheral IV   Procedures and diagnostic studies:   No results found.   Medical Consultants:    None.  Anti-Infectives:   none  Subjective:    Dawn [name] headache has resolved her appetite has improved. Objective:    Vitals:   03/11/20 1535 03/11/20 2103 03/12/20 0010 03/12/20 0400  BP: 98/61 114/64 120/63 (!) 130/54  Pulse: 60 61 (!) 55 (!) 55  Resp: 15 16 18 11   Temp: 98.4 F (36.9 C) 98.4 F (36.9 C) 98 F (36.7 C) 98.4 F (36.9 C)  TempSrc: Oral Oral Oral Oral  SpO2: 100% 100% 99% 98%  Weight:    53.2 kg  Height:       SpO2: 98 %   Intake/Output Summary (Last 24 hours) at 03/12/2020 0831 Last data filed at 03/12/2020 4270 Gross per 24 hour  Intake 2090.71 ml  Output 3500 ml  Net -1409.29 ml   Filed Weights   03/10/20 0645 03/11/20 0347 03/12/20 0400  Weight: 52.2 kg 53.2 kg 53.2 kg    Exam: General exam: In no acute distress. Respiratory system: Good air movement and clear to auscultation. Cardiovascular system: S1 & S2 heard, RRR. No JVD. Gastrointestinal system: Abdomen is nondistended, soft and nontender.  Extremities: No  pedal edema. Skin: No rashes, lesions or ulcers Psychiatry: Judgement and insight appear normal. Mood & affect appropriate. Data Reviewed:    Labs: Basic Metabolic Panel: Recent Labs  Lab 03/06/20 2200 03/07/20 0237 03/07/20 0812 03/07/20 1127 03/08/20 0330 03/08/20 0530 03/09/20 0545 03/09/20 0740 03/09/20 1024 03/11/20 1037 03/11/20 1437 03/11/20 1833 03/11/20 2230 03/12/20 0253 03/12/20 0641  NA 114* 121* 128*  126*   < > 111*   < > 115*  117* 117*   < > 131* 133* 133* 135 134* 135  K  --   --  2.9*   < > 3.3*   < > 3.2* 3.7  --  4.3  --   --   --   --   --   CL  --   --  88*  --  78*  --  84* 86*  --  97*  --   --   --   --   --   CO2  --   --  27  --  25  --  21* 23  --  24  --   --   --   --   --   GLUCOSE  --    --  114*  --  112*  --  86 91  --  111*  --   --   --   --   --   BUN  --   --  6*  --  <5*  --  <5* 5*  --  5*  --   --   --   --   --   CREATININE  --   --  0.87  --  0.69  --  0.68 0.82  --  0.87  --   --   --   --   --   CALCIUM  --   --  9.0  --  8.0*  --  8.3* 8.5*  --  8.9  --   --   --   --   --   MG 1.8 2.0  --   --  1.6*  --   --   --   --   --   --   --   --   --   --   PHOS  --  3.2 3.2  --  2.4*  --   --   --   --   --   --   --   --   --   --    < > = values in this interval not displayed.   GFR Estimated Creatinine Clearance: 49.9 mL/min (by C-G formula based on SCr of 0.87 mg/dL). Liver Function Tests: Recent Labs  Lab 03/06/20 1721 03/07/20 0812  AST 30  --   ALT 10  --   ALKPHOS 81  --   BILITOT 0.9  --   PROT 7.0  --   ALBUMIN 3.9 3.7   Recent Labs  Lab 03/06/20 1720  LIPASE 23   No results for input(s): AMMONIA in the last 168 hours. Coagulation profile No results for input(s): INR, PROTIME in the last 168 hours. COVID-19 Labs  No results for input(s): DDIMER, FERRITIN, LDH, CRP in the last 72 hours.  Lab Results  Component Value Date   [place] NEGATIVE 03/06/2020   Home NEGATIVE 07/28/2019   SARSCOV2NAA RESULT: NEGATIVE 06/24/2019    CBC: Recent Labs  Lab 03/06/20 1618 03/06/20 1720 03/06/20 2200 03/09/20 0328  WBC  --  8.2 7.8 8.8  NEUTROABS  --  6.5  --   --   HGB 12.6 11.3* 9.9* 9.7*  HCT 37.0 30.1* 26.9* 26.4*  MCV  --  73.6* 73.9* 75.9*  PLT  --  258 237 226   Cardiac Enzymes: No results for input(s): CKTOTAL, CKMB, CKMBINDEX, TROPONINI in the last 168 hours. BNP (last 3 results) No results for input(s): PROBNP in the last 8760 hours. CBG: Recent Labs  Lab 03/07/20 0435  GLUCAP 121*   D-Dimer: No results for input(s): DDIMER in the last 72 hours. Hgb A1c: No results for input(s): HGBA1C in the last 72 hours. Lipid Profile: No results for input(s): CHOL, HDL, LDLCALC, TRIG, CHOLHDL, LDLDIRECT in the last 72  hours. Thyroid function studies: No results for input(s): TSH, T4TOTAL, T3FREE, THYROIDAB in the last 72 hours.  Invalid input(s): FREET3 Anemia work up: No results for input(s): VITAMINB12, FOLATE, FERRITIN, TIBC, IRON, RETICCTPCT in the last 72 hours. Sepsis Labs: Recent Labs  Lab 03/06/20 1720 03/06/20 2200 03/09/20 0328  WBC 8.2 7.8 8.8   Microbiology Recent Results (from the past 240 hour(s))  SARS Coronavirus 2 by RT PCR (hospital order, performed in [facility] hospital lab) Nasopharyngeal Nasopharyngeal Swab     Status: None   Collection Time: 03/06/20  7:05 PM   Specimen: Nasopharyngeal Swab  Result Value Ref Range Status   SARS Coronavirus 2 NEGATIVE NEGATIVE Final    Comment: (NOTE) SARS-CoV-2 target nucleic acids are NOT DETECTED.  The SARS-CoV-2 RNA is generally detectable in upper and lower respiratory specimens during the acute phase of infection. The lowest concentration of SARS-CoV-2 viral copies this assay can detect is 250 copies / mL. A negative result does not preclude SARS-CoV-2 infection and should not be used as the sole basis for treatment or other patient management decisions.  A negative result may occur with improper specimen collection / handling, submission of specimen other than nasopharyngeal swab, presence of viral mutation(s) within the areas targeted by this assay, and inadequate number of viral copies (<250 copies / mL). A negative result must be combined with clinical observations, patient history, and epidemiological information.  Fact Sheet for Patients:   StrictlyIdeas.no  Fact Sheet for Healthcare Providers: BankingDealers.co.za  This test is not yet approved or  cleared by the [place] FDA and has been authorized for detection and/or diagnosis of SARS-CoV-2 by FDA under an Emergency Use Authorization (EUA).  This EUA will remain in effect (meaning this test can be used) for the  duration of the COVID-19 declaration under Section 564(b)(1) of the Act, 21 U.S.C. section 360bbb-3(b)(1), unless the authorization is terminated or revoked sooner.  Performed at [facility], [place] [address], [place], [place] [address]   MRSA PCR Screening     Status: None   Collection Time: 03/07/20 12:45 AM   Specimen: Nasal Mucosa; Nasopharyngeal  Result Value Ref Range Status   MRSA by PCR NEGATIVE NEGATIVE Final    Comment:        The GeneXpert MRSA Assay (FDA approved for NASAL specimens only), is one component of a comprehensive MRSA colonization surveillance program. It is not intended to diagnose MRSA infection nor to guide or monitor treatment for MRSA infections. Performed at [facility], [place] [address], [place], [place] [address]      Medications:   . famotidine  20 mg Oral BID  . heparin  5,000 Units Subcutaneous Q8H  . polyethylene glycol  17 g Oral BID  . sodium chloride flush  10-40 mL Intracatheter Q12H   Continuous Infusions:     LOS: 6 days   Charlynne Cousins  [facility]  03/12/2020, 8:31 AM

## 2020-03-13 NOTE — Evaluation (Signed)
Physical Therapy Evaluation Patient Details Name: Dawn [name] MRN: [id number] DOB: 07-29-1955 Today's Date: 03/13/2020   History of Present Illness  This 65 y.o. [place]) female reformed smoker presented to the [facility] Emergency Department today with complaints of numbness and tingling and blurred vision.  She also had a bout of emesis upon arrival in the ER. According to the patient's daughter, the patient was started on new medications on 02/18/2020 (losartan, HCTZ, gabapentin, meloxicam) and subsequently developed blurry vision and worsening numbness.  Clinical Impression  Patient is received in bed, understands some [place]. She is independent with bed mobility, transfers with supervision. She ambulated 40 feet in room initially with min guard assist, then independently. Her mobility appears to be near baseline, just more cautious. Patient will continue to benefit from skilled PT while here to improve mobility and activity tolerance.        Follow Up Recommendations No PT follow up    Equipment Recommendations  None recommended by PT    Recommendations for Other Services       Precautions / Restrictions Precautions Precautions: Fall Restrictions Weight Bearing Restrictions: No      Mobility  Bed Mobility Overal bed mobility: Independent                  Transfers Overall transfer level: Independent Equipment used: None                Ambulation/Gait Ambulation/Gait assistance: Counsellor (Feet): 40 Feet Assistive device: 1 person hand held assist Gait Pattern/deviations: Decreased stride length;Decreased step length - right;Decreased step length - left;Step-through pattern Gait velocity: decr   General Gait Details: steady with ambulation, however slowed and cautious. Patient ambulated last 10 feet without ad and no support from PT  Stairs            Wheelchair Mobility    Modified Rankin (Stroke Patients  Only)       Balance Overall balance assessment: Independent                                           Pertinent Vitals/Pain Pain Assessment: No/denies pain    Home Living Family/patient expects to be discharged to:: Private residence Living Arrangements: Children Available Help at Discharge: Family;Available PRN/intermittently             Additional Comments: unsure of home environment. Patient doesnot speak english well. Plan to call daughter.    Prior Function Level of Independence: Independent               Hand Dominance        Extremity/Trunk Assessment   Upper Extremity Assessment Upper Extremity Assessment: Generalized weakness    Lower Extremity Assessment Lower Extremity Assessment: Generalized weakness    Cervical / Trunk Assessment Cervical / Trunk Assessment: Normal  Communication   Communication: Prefers language other than English  Cognition Arousal/Alertness: Awake/alert Behavior During Therapy: WFL for tasks assessed/performed Overall Cognitive Status: Within Functional Limits for tasks assessed                                        General Comments      Exercises     Assessment/Plan    PT Assessment Patient needs continued PT services  PT Problem List Decreased strength;Decreased mobility;Decreased activity tolerance       PT Treatment Interventions Therapeutic activities;Gait training;Therapeutic exercise;Functional mobility training;Stair training;Patient/family education    PT Goals (Current goals can be found in the Care Plan section)  Acute Rehab PT Goals Patient Stated Goal: to return home PT Goal Formulation: With patient Time For Goal Achievement: 03/18/20 Potential to Achieve Goals: Good    Frequency Min 2X/week   Barriers to discharge        Co-evaluation               AM-PAC PT "6 Clicks" Mobility  Outcome Measure Help needed turning from your back to your side  while in a flat bed without using bedrails?: None Help needed moving from lying on your back to sitting on the side of a flat bed without using bedrails?: None Help needed moving to and from a bed to a chair (including a wheelchair)?: A Little Help needed standing up from a chair using your arms (e.g., wheelchair or bedside chair)?: None Help needed to walk in hospital room?: A Little Help needed climbing 3-5 steps with a railing? : A Little 6 Click Score: 21    End of Session   Activity Tolerance: Patient tolerated treatment well Patient left: in chair;with call bell/phone within reach Nurse Communication: Mobility status PT Visit Diagnosis: Muscle weakness (generalized) (M62.81)    Time: 8841-6606 PT Time Calculation (min) (ACUTE ONLY): 16 min   Charges:   PT Evaluation $PT Eval Moderate Complexity: 1 Mod          Emilyn Ruble, PT, GCS 03/13/20,11:12 AM

## 2020-03-13 NOTE — Discharge Summary (Signed)
Physician Discharge Summary  Dawn [name] PRF:[id number] DOB: 03-Nov-1955 DOA: 03/06/2020  PCP: Libby Maw, MD  Admit date: 03/06/2020 Discharge date: 03/13/2020  Admitted From: Home  Disposition: Home   Recommendations for Outpatient Follow-up:  1. Follow up with PCP in 1-2 weeks 2. Please obtain BMP/CBC in one week   Home Health:no Equipment/Devices:none  Discharge Condition:stable CODE STATUS:full Diet recommendation: Heart Healthy   Brief/Interim Summary: 64 y.o. female [place] former smoker admitted on 03/06/2020 with complaint of numbness tingling and blurry vision upon arrival to the ED she started having emesis.  According to patient daughter she was started on recently on medications (losartan hydrochlorothiazide gabapentin and meloxicam) subsequently she started developing the symptoms in the ER she was found to have a sodium of 107 CT scan of the head was negative for acute intracranial processes neurology was consulted recommended to treat sodium with 3% saline.  Discharge Diagnoses:  Principal Problem:   Acute hyponatremia Active Problems:   Numbness   Emesis Acute hypotonic hyponatremia: Likely due to his hydrochlorothiazide and meloxicam. Nephrology was consulted and she was started on 3% saline and sodium improved with the next 5 days. Her sodium on the day of discharge is 132. She will follow up with her primary care in 2 weeks. She should not be on hydrochlorothiazide as an outpatient.  Severe hypokalemia: Replete orally now resolved.  Essential hypertension: Her hydrochlorothiazide was discontinued. Continue losartan follow-up with PCP and titrate antihypertensive medications as needed.  Chronic pain: Continue current medications at home.  Avoid meloxicam as this might of contributed to her hyponatremia.   Discharge Instructions  Discharge Instructions    Diet - low sodium heart healthy   Complete by: As directed    Increase activity  slowly   Complete by: As directed      Allergies as of 03/13/2020      Reactions   Thiazide-type Diuretics Other (See Comments)   Hyponatremia serum sodium 107      Medication List    STOP taking these medications   hydrochlorothiazide 25 MG tablet Commonly known as: HYDRODIURIL   meloxicam 7.5 MG tablet Commonly known as: MOBIC     TAKE these medications   acetaminophen 500 MG tablet Commonly known as: TYLENOL Take 1,000 mg by mouth in the morning and at bedtime.   gabapentin 100 MG capsule Commonly known as: NEURONTIN Take 100 mg by mouth daily.   losartan 100 MG tablet Commonly known as: COZAAR Take 1 tablet (100 mg total) by mouth daily. What changed: when to take this       Allergies  Allergen Reactions  . Thiazide-Type Diuretics Other (See Comments)    Hyponatremia serum sodium 107    Consultations:  Nephrology   Procedures/Studies: CT HEAD WO CONTRAST  Result Date: 03/06/2020 CLINICAL DATA:  Headache and dizziness EXAM: CT HEAD WITHOUT CONTRAST TECHNIQUE: Contiguous axial images were obtained from the base of the skull through the vertex without intravenous contrast. COMPARISON:  Head CT August 29, 2009 FINDINGS: Brain: No evidence of acute infarction, hemorrhage, hydrocephalus, extra-axial collection or mass lesion/mass effect. Mild (5 mm) of cerebellar tonsillar ectopy, similar prior. Vascular: No hyperdense vessel or unexpected calcification. 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 Sinuses/Orbits: No acute finding. Other: None. IMPRESSION: 1. No acute intracranial findings. Electronically Signed   By: Dahlia Bailiff MD  On: 03/06/2020 12:27   MR BRAIN WO CONTRAST  Result Date: 03/06/2020 CLINICAL DATA:  Nonspecific dizziness.  Headache. EXAM: MRI HEAD WITHOUT CONTRAST TECHNIQUE: Multiplanar, multiecho pulse  sequences of the brain and surrounding structures were obtained without intravenous contrast. COMPARISON:  Head CT earlier same day FINDINGS: Brain: The brain has a normal appearance without evidence of malformation, atrophy, old or acute small or large vessel infarction, mass lesion, hemorrhage, hydrocephalus or extra-axial collection. Vascular: Major vessels at the base of the brain show flow. Venous sinuses appear patent. Skull and upper cervical spine: Normal. Sinuses/Orbits: Clear/normal. Other: None significant. No fluid in the middle ears or mastoid regions. IMPRESSION: Normal examination. No abnormality seen to explain the presenting symptoms. Electronically Signed   By: Nelson Chimes M.D.   On: 03/06/2020 21:30   DG Chest Port 1 View  Result Date: 03/08/2020 CLINICAL DATA:  Respiratory distress EXAM: PORTABLE CHEST 1 VIEW COMPARISON:  03/06/2020 FINDINGS: The heart size and mediastinal contours are within normal limits. Both lungs are clear. The visualized skeletal structures are unremarkable. IMPRESSION: No active disease. Electronically Signed   By: Fidela Salisbury MD   On: 03/08/2020 05:49   DG Chest Portable 1 View  Result Date: 03/06/2020 CLINICAL DATA:  Chest pain EXAM: PORTABLE CHEST 1 VIEW COMPARISON:  05/19/2019 FINDINGS: The heart size and mediastinal contours are within normal limits. Both lungs are clear. The visualized skeletal structures are unremarkable. IMPRESSION: No active disease. Electronically Signed   By: Inez Catalina M.D.   On: 03/06/2020 16:42    (Echo, Carotid, EGD, Colonoscopy, ERCP)    Subjective: No complains  Discharge Exam: Vitals:   03/13/20 0430 03/13/20 0806  BP: 126/64 129/66  Pulse: 74 72  Resp: 19 18  Temp: 98.3 F (36.8 C) 99.1 F (37.3 C)  SpO2: 99% 100%   Vitals:   03/13/20 0015 03/13/20 0430 03/13/20 0500 03/13/20 0806  BP: 123/66 126/64  129/66  Pulse: 77 74  72  Resp: 20 19  18   Temp: 98.2 F (36.8 C) 98.3 F (36.8 C)  99.1 F (37.3  C)  TempSrc: Oral Oral  Oral  SpO2: 100% 99%  100%  Weight:   52.6 kg   Height:        General: Pt is alert, awake, not in acute distress Cardiovascular: RRR, S1/S2 +, no rubs, no gallops Respiratory: CTA bilaterally, no wheezing, no rhonchi Abdominal: Soft, NT, ND, bowel sounds + Extremities: no edema, no cyanosis    The results of significant diagnostics from this hospitalization (including imaging, microbiology, ancillary and laboratory) are listed below for reference.     Microbiology: Recent Results (from the past 240 hour(s))  SARS Coronavirus 2 by RT PCR (hospital order, performed in [facility] hospital lab) Nasopharyngeal Nasopharyngeal Swab     Status: None   Collection Time: 03/06/20  7:05 PM   Specimen: Nasopharyngeal Swab  Result Value Ref Range Status   SARS Coronavirus 2 NEGATIVE NEGATIVE Final    Comment: (NOTE) SARS-CoV-2 target nucleic acids are NOT DETECTED.  The SARS-CoV-2 RNA is generally detectable in upper and lower respiratory specimens during the acute phase of infection. The lowest concentration of SARS-CoV-2 viral copies this assay can detect is 250 copies / mL. A negative result does not preclude SARS-CoV-2 infection and should not be used as the sole basis for treatment or other patient management decisions.  A negative result may occur with improper specimen collection / handling, submission of specimen other than nasopharyngeal swab, presence  of viral mutation(s) within the areas targeted by this assay, and inadequate number of viral copies (<250 copies / mL). A negative result must be combined with clinical observations, patient history, and epidemiological information.  Fact Sheet for Patients:   StrictlyIdeas.no  Fact Sheet for Healthcare Providers: BankingDealers.co.za  This test is not yet approved or  cleared by the [place] FDA and has been authorized for detection and/or diagnosis  of SARS-CoV-2 by FDA under an Emergency Use Authorization (EUA).  This EUA will remain in effect (meaning this test can be used) for the duration of the COVID-19 declaration under Section 564(b)(1) of the Act, 21 U.S.C. section 360bbb-3(b)(1), unless the authorization is terminated or revoked sooner.  Performed at [facility], [place] [address] [place], [place] [address]   MRSA PCR Screening     Status: None   Collection Time: 03/07/20 12:45 AM   Specimen: Nasal Mucosa; Nasopharyngeal  Result Value Ref Range Status   MRSA by PCR NEGATIVE NEGATIVE Final    Comment:        The GeneXpert MRSA Assay (FDA approved for NASAL specimens only), is one component of a comprehensive MRSA colonization surveillance program. It is not intended to diagnose MRSA infection nor to guide or monitor treatment for MRSA infections. Performed at [facility], [place] [address], [place], [place] [address]      Labs: BNP (last 3 results) Recent Labs    03/06/20 2200  BNP [id number]   Basic Metabolic Panel: Recent Labs  Lab 03/06/20 2200 03/07/20 0237 03/07/20 0812 03/07/20 1127 03/08/20 0330 03/08/20 0530 03/08/20 1302 03/08/20 1611 03/09/20 0545 03/09/20 0740 03/09/20 1024 03/11/20 1037 03/11/20 1437 03/11/20 1833 03/11/20 2230 03/12/20 0253 03/12/20 0641 03/12/20 1036  NA 114* 121* 128*  126*   < > 111*   < > 108*   < > 115*  117* 117*   < > 131*   < > 133* 135 134* 135 132*  K  --   --  2.9*   < > 3.3*  --  3.5  --  3.2* 3.7  --  4.3  --   --   --   --   --   --   CL  --   --  88*  --  78*  --   --   --  84* 86*  --  97*  --   --   --   --   --   --   CO2  --   --  27  --  25  --   --   --  21* 23  --  24  --   --   --   --   --   --   GLUCOSE  --   --  114*  --  112*  --   --   --  86 91  --  111*  --   --   --   --   --   --   BUN  --   --  6*  --  <5*  --   --   --  <5* 5*  --  5*  --   --   --   --   --   --   CREATININE  --   --  0.87  --  0.69  --   --   --  0.68  0.82  --  0.87  --   --   --   --   --   --  CALCIUM  --   --  9.0  --  8.0*  --   --   --  8.3* 8.5*  --  8.9  --   --   --   --   --   --   MG 1.8 2.0  --   --  1.6*  --   --   --   --   --   --   --   --   --   --   --   --   --   PHOS  --  3.2 3.2  --  2.4*  --   --   --   --   --   --   --   --   --   --   --   --   --    < > = values in this interval not displayed.   Liver Function Tests: Recent Labs  Lab 03/06/20 1721 03/07/20 0812  AST 30  --   ALT 10  --   ALKPHOS 81  --   BILITOT 0.9  --   PROT 7.0  --   ALBUMIN 3.9 3.7   Recent Labs  Lab 03/06/20 1720  LIPASE 23   No results for input(s): AMMONIA in the last 168 hours. CBC: Recent Labs  Lab 03/06/20 1618 03/06/20 1720 03/06/20 2200 03/09/20 0328  WBC  --  8.2 7.8 8.8  NEUTROABS  --  6.5  --   --   HGB 12.6 11.3* 9.9* 9.7*  HCT 37.0 30.1* 26.9* 26.4*  MCV  --  73.6* 73.9* 75.9*  PLT  --  258 237 226   Cardiac Enzymes: No results for input(s): CKTOTAL, CKMB, CKMBINDEX, TROPONINI in the last 168 hours. BNP: Invalid input(s): POCBNP CBG: Recent Labs  Lab 03/07/20 0435  GLUCAP 121*   D-Dimer No results for input(s): DDIMER in the last 72 hours. Hgb A1c No results for input(s): HGBA1C in the last 72 hours. Lipid Profile No results for input(s): CHOL, HDL, LDLCALC, TRIG, CHOLHDL, LDLDIRECT in the last 72 hours. Thyroid function studies No results for input(s): TSH, T4TOTAL, T3FREE, THYROIDAB in the last 72 hours.  Invalid input(s): FREET3 Anemia work up No results for input(s): VITAMINB12, FOLATE, FERRITIN, TIBC, IRON, RETICCTPCT in the last 72 hours. Urinalysis    Component Value Date/Time   COLORURINE COLORLESS (A) 03/06/2020 2049   APPEARANCEUR CLEAR 03/06/2020 2049   LABSPEC 1.002 (L) 03/06/2020 2049   PHURINE 6.0 03/06/2020 2049   GLUCOSEU NEGATIVE 03/06/2020 2049   GLUCOSEU NEGATIVE 06/04/2019 1145   HGBUR NEGATIVE 03/06/2020 2049   BILIRUBINUR NEGATIVE 03/06/2020 2049   KETONESUR  NEGATIVE 03/06/2020 2049   PROTEINUR NEGATIVE 03/06/2020 2049   UROBILINOGEN 0.2 06/04/2019 1145   NITRITE NEGATIVE 03/06/2020 2049   LEUKOCYTESUR NEGATIVE 03/06/2020 2049   Sepsis Labs Invalid input(s): PROCALCITONIN,  WBC,  LACTICIDVEN Microbiology Recent Results (from the past 240 hour(s))  SARS Coronavirus 2 by RT PCR (hospital order, performed in [facility] lab) Nasopharyngeal Nasopharyngeal Swab     Status: None   Collection Time: 03/06/20  7:05 PM   Specimen: Nasopharyngeal Swab  Result Value Ref Range Status   SARS Coronavirus 2 NEGATIVE NEGATIVE Final    Comment: (NOTE) SARS-CoV-2 target nucleic acids are NOT DETECTED.  The SARS-CoV-2 RNA is generally detectable in upper and lower respiratory specimens during the acute phase of infection. The lowest concentration of SARS-CoV-2 viral copies this assay can detect is 250  copies / mL. A negative result does not preclude SARS-CoV-2 infection and should not be used as the sole basis for treatment or other patient management decisions.  A negative result may occur with improper specimen collection / handling, submission of specimen other than nasopharyngeal swab, presence of viral mutation(s) within the areas targeted by this assay, and inadequate number of viral copies (<250 copies / mL). A negative result must be combined with clinical observations, patient history, and epidemiological information.  Fact Sheet for Patients:   StrictlyIdeas.no  Fact Sheet for Healthcare Providers: BankingDealers.co.za  This test is not yet approved or  cleared by the [place] FDA and has been authorized for detection and/or diagnosis of SARS-CoV-2 by FDA under an Emergency Use Authorization (EUA).  This EUA will remain in effect (meaning this test can be used) for the duration of the COVID-19 declaration under Section 564(b)(1) of the Act, 21 U.S.C. section 360bbb-3(b)(1), unless  the authorization is terminated or revoked sooner.  Performed at [facility], [place] [address], [place], [place] [address]   MRSA PCR Screening     Status: None   Collection Time: 03/07/20 12:45 AM   Specimen: Nasal Mucosa; Nasopharyngeal  Result Value Ref Range Status   MRSA by PCR NEGATIVE NEGATIVE Final    Comment:        The GeneXpert MRSA Assay (FDA approved for NASAL specimens only), is one component of a comprehensive MRSA colonization surveillance program. It is not intended to diagnose MRSA infection nor to guide or monitor treatment for MRSA infections. Performed at [facility], [place] [address], [place], [place] [address]      Time coordinating discharge: Over 30 minutes  SIGNED:   Charlynne Cousins, MD  [facility] 03/13/2020, 9:52 AM Pager   If 7PM-7AM, please contact night-coverage [email or web address] Password [email or web address]

## 2020-03-14 ENCOUNTER — Telehealth: Payer: Self-pay | Admitting: *Deleted

## 2020-03-14 NOTE — Telephone Encounter (Signed)
Transition Care Management Unsuccessful Follow-up Telephone Call  Date of discharge and from where:  03/13/2020 - [facility]  Attempts:  1st Attempt  Reason for unsuccessful TCM follow-up call:  Voice mail full

## 2020-03-15 DIAGNOSIS — G629 Polyneuropathy, unspecified: Secondary | ICD-10-CM | POA: Insufficient documentation

## 2020-03-15 NOTE — Telephone Encounter (Signed)
Transition Care Management Unsuccessful Follow-up Telephone Call  Date of discharge and from where:  03/13/2020 - [facility]  Attempts:  2nd Attempt  Reason for unsuccessful TCM follow-up call:  Voice mail full

## 2020-03-16 NOTE — Telephone Encounter (Signed)
Transition Care Management Unsuccessful Follow-up Telephone Call  Date of discharge and from where:  03/13/2020 - [facility]  Attempts:  3rd Attempt  Reason for unsuccessful TCM follow-up call:  Voice mail full

## 2020-07-06 DIAGNOSIS — Z832 Family history of diseases of the blood and blood-forming organs and certain disorders involving the immune mechanism: Secondary | ICD-10-CM | POA: Insufficient documentation

## 2021-01-01 IMAGING — MG DIGITAL SCREENING BILAT W/ TOMO W/ CAD
6 of 10 series · 6 of 30 positions shown · non-contrast
Comparison: None.

CLINICAL DATA: Screening.

EXAM:
DIGITAL SCREENING BILATERAL MAMMOGRAM WITH TOMO AND CAD

[R XCCL synth-2D]
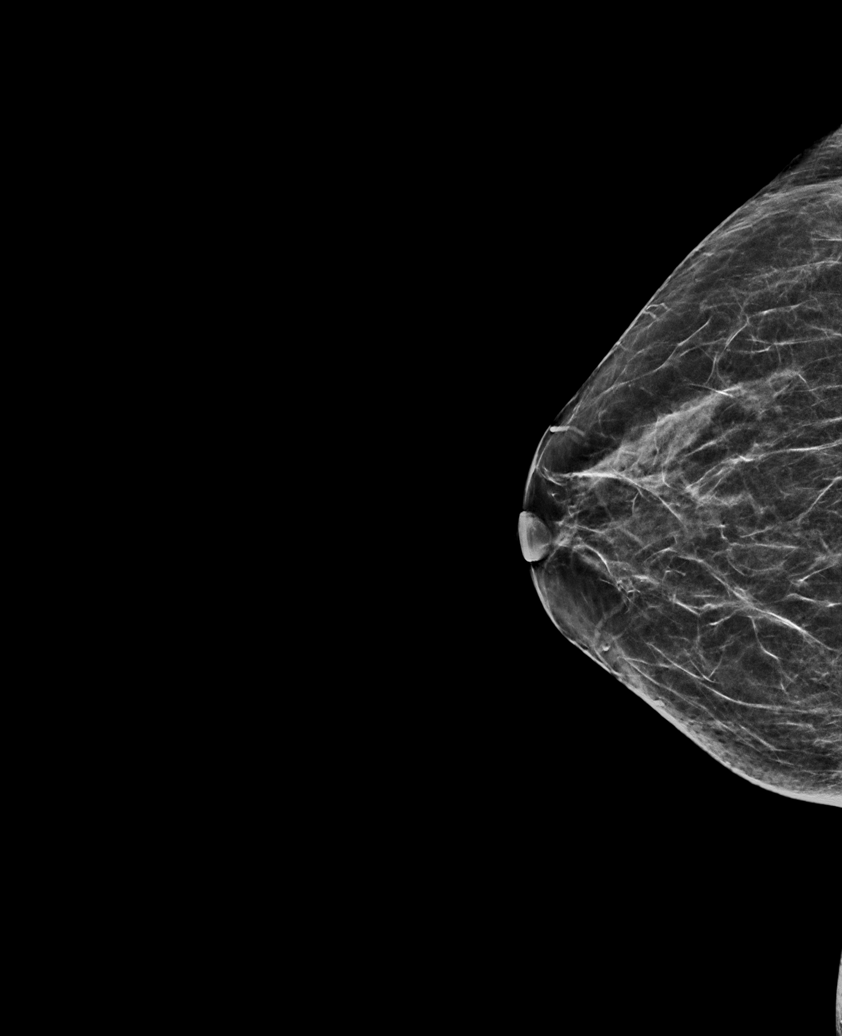

[L CC synth-2D]
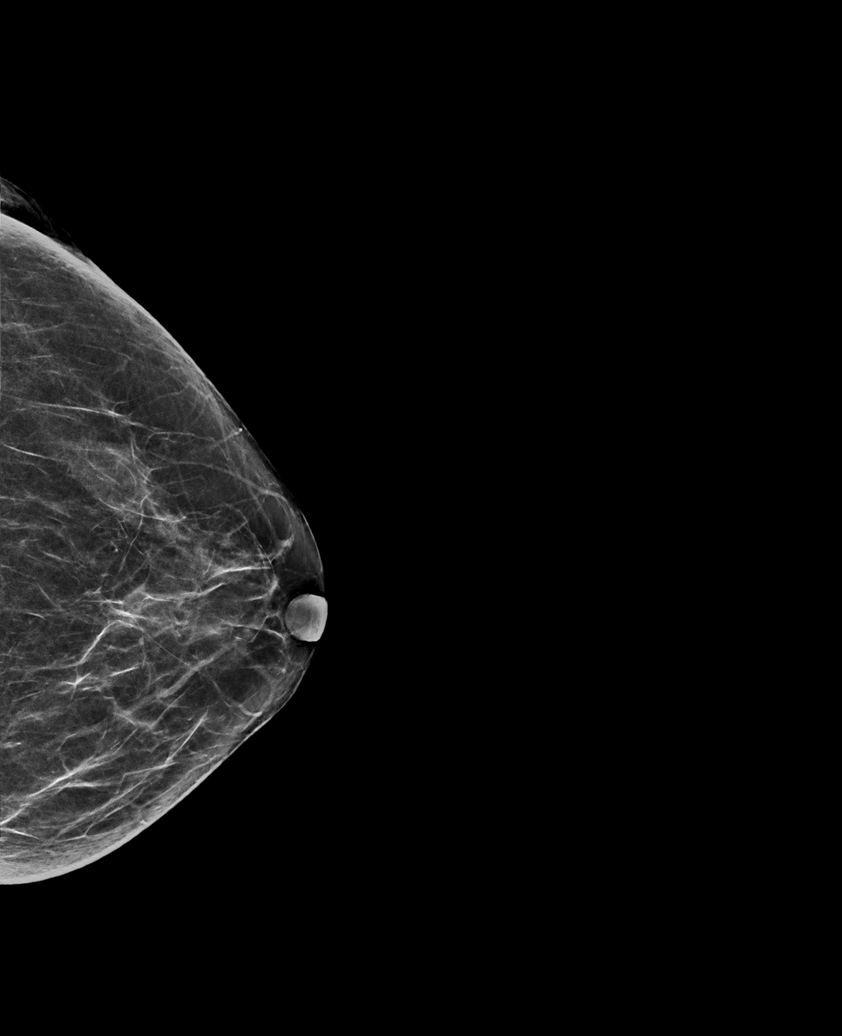

[R CC synth-2D]
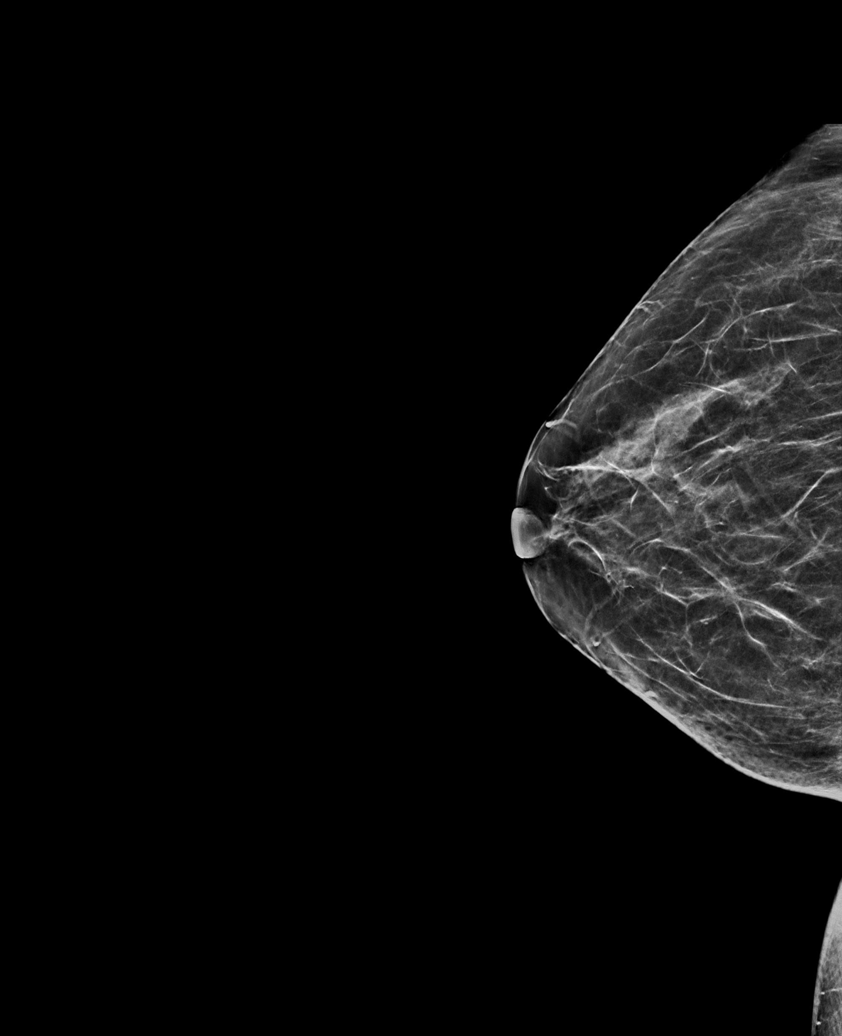

[R MLO synth-2D]
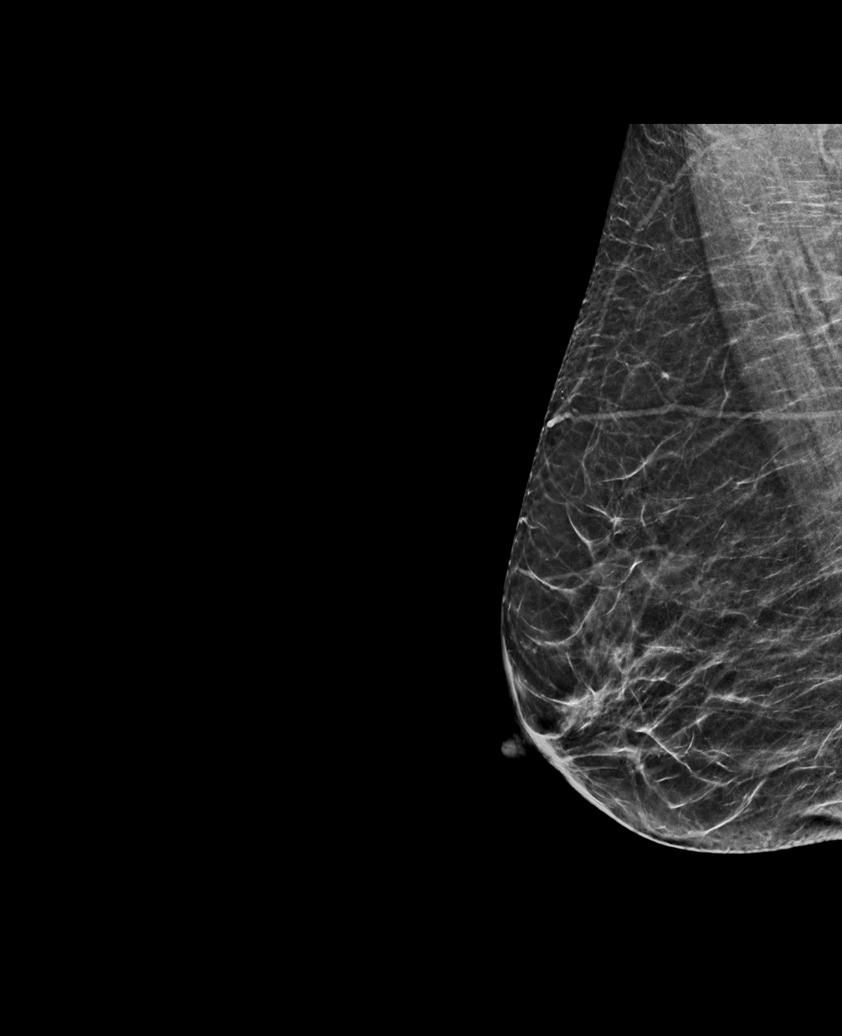

[L MLO synth-2D]
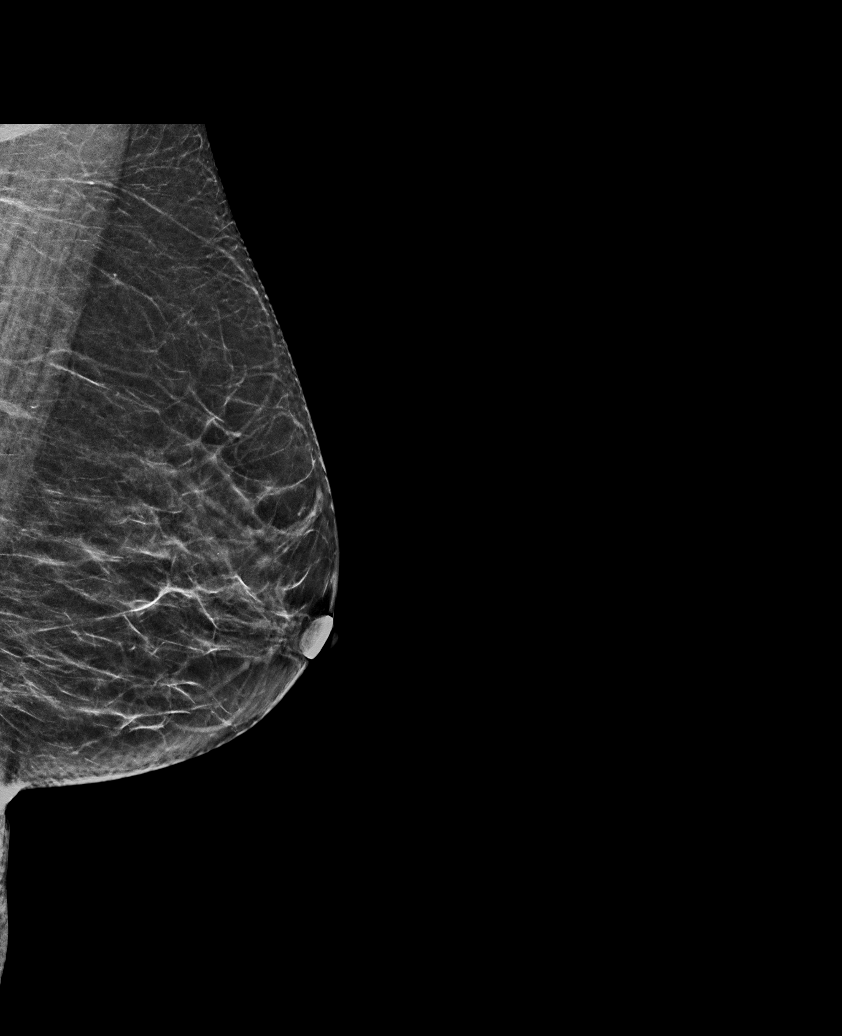

[L MLO tomo · tomo slice 28/55.0]
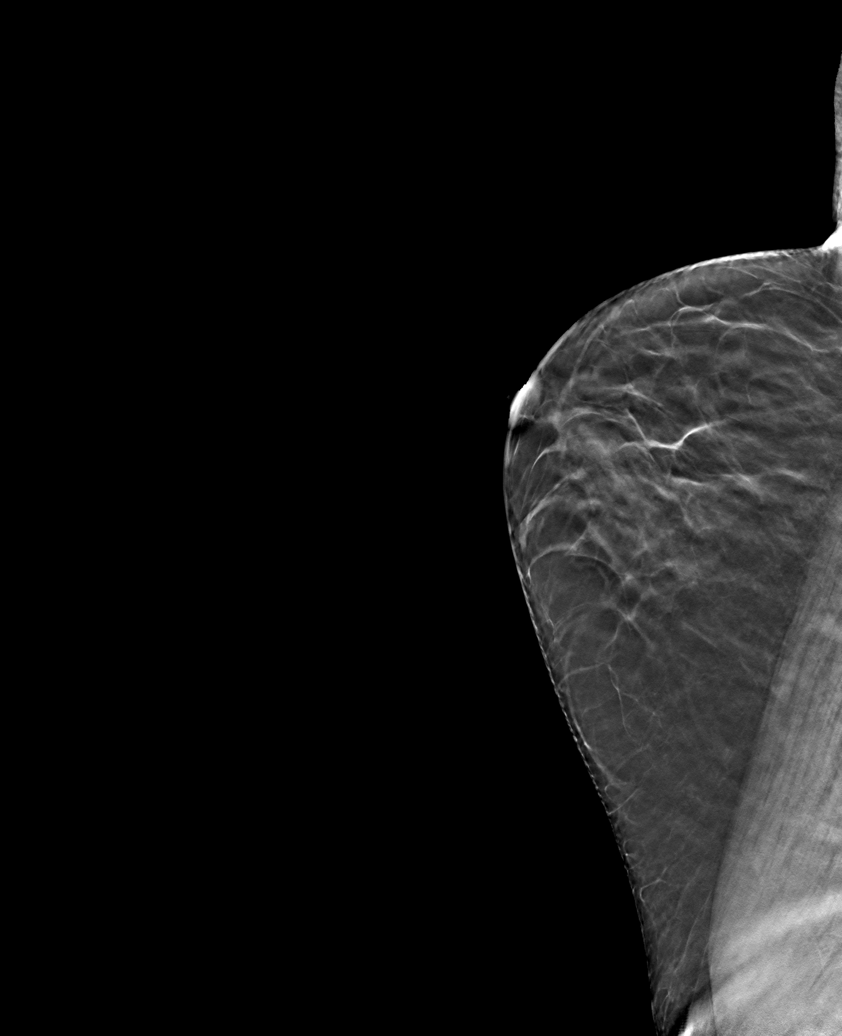

[6 of 30 positions shown; findings below may reference images not displayed]

ACR Breast Density Category b: There are scattered areas of
fibroglandular density.
FINDINGS: There are no findings suspicious for malignancy. Images were
processed with CAD.
IMPRESSION: No mammographic evidence of malignancy. A result letter of this
screening mammogram will be mailed directly to the patient.

RECOMMENDATION:
Screening mammogram in one year. (Code:Y5-G-EJ6)

BI-RADS CATEGORY  1: Negative.

## 2021-01-16 DIAGNOSIS — K644 Residual hemorrhoidal skin tags: Secondary | ICD-10-CM | POA: Insufficient documentation

## 2021-08-03 ENCOUNTER — Other Ambulatory Visit: Payer: Self-pay | Admitting: Nurse Practitioner

## 2021-08-03 DIAGNOSIS — Z1231 Encounter for screening mammogram for malignant neoplasm of breast: Secondary | ICD-10-CM

## 2021-09-21 ENCOUNTER — Other Ambulatory Visit: Payer: Self-pay | Admitting: Infectious Diseases

## 2021-09-21 DIAGNOSIS — Z1231 Encounter for screening mammogram for malignant neoplasm of breast: Secondary | ICD-10-CM

## 2021-10-05 ENCOUNTER — Other Ambulatory Visit: Payer: Self-pay | Admitting: Family Medicine

## 2021-10-05 DIAGNOSIS — Z1231 Encounter for screening mammogram for malignant neoplasm of breast: Secondary | ICD-10-CM

## 2021-10-26 ENCOUNTER — Ambulatory Visit: Payer: Medicare Other

## 2021-12-28 ENCOUNTER — Other Ambulatory Visit: Payer: Self-pay | Admitting: Family Medicine

## 2021-12-28 ENCOUNTER — Ambulatory Visit
Admission: RE | Admit: 2021-12-28 | Discharge: 2021-12-28 | Disposition: A | Discharge status: Home / Self Care | Payer: Medicare HMO | Source: Ambulatory Visit | Attending: Family Medicine | Admitting: Family Medicine

## 2021-12-28 DIAGNOSIS — M545 Low back pain, unspecified: Secondary | ICD-10-CM

## 2022-01-14 ENCOUNTER — Ambulatory Visit
Admission: RE | Admit: 2022-01-14 | Discharge: 2022-01-14 | Disposition: A | Discharge status: Home / Self Care | Payer: Medicare HMO | Source: Ambulatory Visit | Attending: Registered Nurse | Admitting: Registered Nurse

## 2022-01-14 ENCOUNTER — Other Ambulatory Visit: Payer: Self-pay | Admitting: Registered Nurse

## 2022-01-14 DIAGNOSIS — M545 Low back pain, unspecified: Secondary | ICD-10-CM

## 2022-01-29 ENCOUNTER — Other Ambulatory Visit: Payer: Self-pay | Admitting: Family Medicine

## 2022-01-29 DIAGNOSIS — E2839 Other primary ovarian failure: Secondary | ICD-10-CM

## 2022-08-21 ENCOUNTER — Ambulatory Visit: Payer: Medicare HMO | Admitting: Skilled Nursing Facility1

## 2023-05-13 ENCOUNTER — Encounter: Payer: Self-pay | Admitting: Registered Nurse

## 2023-05-13 DIAGNOSIS — F17210 Nicotine dependence, cigarettes, uncomplicated: Secondary | ICD-10-CM

## 2023-05-14 ENCOUNTER — Other Ambulatory Visit: Payer: Self-pay | Admitting: Registered Nurse

## 2023-05-14 DIAGNOSIS — F17210 Nicotine dependence, cigarettes, uncomplicated: Secondary | ICD-10-CM

## 2023-06-03 ENCOUNTER — Telehealth: Payer: Self-pay

## 2023-06-03 NOTE — Telephone Encounter (Signed)
 Copied from CRM [id number]. Topic: Appointments - Scheduling Inquiry for Clinic >> Jun 03, 2023 11:12 AM Jayson Michael wrote: Reason for CRM: Maryanna Smart called on behalf of the patient, who does not speak [place]. [employer] and KMS show that [facility] is not accepting new patients at this time. Derlene is requesting a new patient appointment for herself and the patient. She reported the patient has a history of fibromyalgia, pain, dizziness, and heaviness in the head. I offered to transfer to a nurse for triage, but derlene declined. Offered to schedule at another location that is accepting new patients, but she was adamant about establishing care at [facility]. 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 She agreed to wait for a callback from the clinic. Callback: Maryanna Smart [phone].

## 2023-06-03 NOTE — Telephone Encounter (Signed)
 I called and spoke with patient's daughter (H'nisia) regarding new pt appointment, informed her Internal Medicine is not accepting new pt at this time.

## 2023-06-12 ENCOUNTER — Encounter (HOSPITAL_COMMUNITY): Payer: Self-pay

## 2023-06-12 ENCOUNTER — Ambulatory Visit (HOSPITAL_COMMUNITY)
Admission: EM | Admit: 2023-06-12 | Discharge: 2023-06-12 | Disposition: A | Discharge status: Home / Self Care | Attending: Family Medicine | Admitting: Family Medicine

## 2023-06-12 DIAGNOSIS — M791 Myalgia, unspecified site: Secondary | ICD-10-CM | POA: Diagnosis not present

## 2023-06-12 HISTORY — DX: Type 2 diabetes mellitus without complications: E11.9

## 2023-06-12 MED ORDER — PREDNISONE 20 MG PO TABS
40.0000 mg | ORAL_TABLET | Freq: Every day | ORAL | 0 refills | Status: AC
Start: 2023-06-12 — End: 2023-06-17

## 2023-06-12 MED ORDER — BACLOFEN 10 MG PO TABS: 10.0000 mg | ORAL_TABLET | Freq: Two times a day (BID) | ORAL | 0 refills | Status: DC | PRN

## 2023-06-12 NOTE — ED Provider Notes (Signed)
 [facility]    CSN: [id number] Arrival date & time: 06/12/23  1018      History   Chief Complaint Chief Complaint  Patient presents with   Generalized Body Aches    HPI Dawn [name] is a 68 y.o. female.   HPI Here for pain that is been going on for about 2 years.  It is worse in the last few days., due to being out of her pain medication.  History is obtained from the patient and her friend.  She had been taking tramadol  for her pain and it was effective, but there was some interactions that were difficult with that provider, and so the patient has decided not to see her anymore.  She does need a new primary care provider  The pain is in her legs and in her back and arms.  Some of it is in the muscles and some is centered around joints.  No recent fever or cough or congestion.  No nausea or vomiting.  She is allergic to thiazide diuretics. Past medical history is negative for diabetes.  I can find in care everywhere a reduced EGFR to 52.  It is unclear if she has had blood work since then. Past Medical History:  Diagnosis Date   Diabetes mellitus without complication (HCC)    Elevated BP without diagnosis of hypertension    Hypertension     Patient Active Problem List   Diagnosis Date Noted   Acute hyponatremia 03/06/2020   Numbness 03/06/2020   Emesis 03/06/2020   Epigastric pain 07/09/2019   Myalgia 06/04/2019   Essential hypertension 06/04/2019   Healthcare maintenance 06/04/2019   Hypokalemia 06/04/2019   CHF (congestive heart failure) (HCC) 07/22/2016   Chest pain 07/22/2016    Past Surgical History:  Procedure Laterality Date   CESAREAN SECTION      OB History   No obstetric history on file.      Home Medications    Prior to Admission medications   Medication Sig Start Date End Date Taking? Authorizing Provider  alendronate (FOSAMAX) 70 MG tablet Take 70 mg by mouth once a week. 04/08/23  Yes [provider]  amLODipine (NORVASC)  2.5 MG tablet Take 1 tablet by mouth daily. 03/22/21  Yes [provider]  atorvastatin (LIPITOR) 20 MG tablet Take 1 tablet by mouth daily. 09/20/21  Yes [provider]  baclofen  (LIORESAL ) 10 MG tablet Take 1 tablet (10 mg total) by mouth every 12 (twelve) hours as needed for muscle spasms. 06/12/23  Yes Randen Kauth K, MD  diclofenac Sodium (VOLTAREN) 1 % GEL Apply 4 g topically. 11/13/20  Yes [provider]  predniSONE  (DELTASONE ) 20 MG tablet Take 2 tablets (40 mg total) by mouth daily with breakfast for 5 days. 06/12/23 06/17/23 Yes Ann Keto, MD  traMADol  (ULTRAM ) 50 MG tablet Take 50 mg by mouth 2 (two) times daily as needed. 05/12/23  Yes [provider]  acetaminophen  (TYLENOL ) 500 MG tablet Take 1,000 mg by mouth in the morning and at bedtime.    [provider]  gabapentin (NEURONTIN) 100 MG capsule Take 100 mg by mouth daily.    [provider]  losartan  (COZAAR ) 100 MG tablet Take 1 tablet (100 mg total) by mouth daily. Patient taking differently: Take 100 mg by mouth at bedtime. 07/09/19   Tonna Frederic, MD    Family History Family History  Problem Relation Age of Onset   Chorea Father    Colon  cancer Neg Hx    Esophageal cancer Neg Hx    Rectal cancer Neg Hx    Stomach cancer Neg Hx     Social History Social History   Tobacco Use   Smoking status: Former    Types: Cigarettes   Smokeless tobacco: Never   Tobacco comments:    Quit 2 months ago per pt  Vaping Use   Vaping status: Never Used  Substance Use Topics   Alcohol use: Never   Drug use: Never     Allergies   Thiazide-type diuretics   Review of Systems Review of Systems   Physical Exam Triage Vital Signs ED Triage Vitals  Encounter Vitals Group     BP 06/12/23 1238 (!) 158/76     Systolic BP Percentile --      Diastolic BP Percentile --      Pulse Rate 06/12/23 1238 70     Resp 06/12/23 1238 16     Temp 06/12/23 1238 98 F  (36.7 C)     Temp Source 06/12/23 1238 Oral     SpO2 06/12/23 1238 95 %     Weight --      Height --      Head Circumference --      Peak Flow --      Pain Score 06/12/23 1209 10     Pain Loc --      Pain Education --      Exclude from Growth Chart --    No data found.  Updated Vital Signs BP (!) 158/76 (BP Location: Right Arm)   Pulse 70   Temp 98 F (36.7 C) (Oral)   Resp 16   SpO2 95%   Visual Acuity Right Eye Distance:   Left Eye Distance:   Bilateral Distance:    Right Eye Near:   Left Eye Near:    Bilateral Near:     Physical Exam Vitals reviewed.  Constitutional:      General: She is not in acute distress.    Appearance: She is not toxic-appearing.  HENT:     Mouth/Throat:     Mouth: Mucous membranes are moist.  Eyes:     Extraocular Movements: Extraocular movements intact.     Conjunctiva/sclera: Conjunctivae normal.     Pupils: Pupils are equal, round, and reactive to light.  Cardiovascular:     Rate and Rhythm: Normal rate and regular rhythm.     Heart sounds: No murmur heard. Pulmonary:     Effort: Pulmonary effort is normal.     Breath sounds: Normal breath sounds.  Musculoskeletal:     Cervical back: Neck supple.  Lymphadenopathy:     Cervical: No cervical adenopathy.  Skin:    Capillary Refill: Capillary refill takes less than 2 seconds.     Coloration: Skin is not jaundiced or pale.     Findings: No rash.  Neurological:     General: No focal deficit present.     Mental Status: She is alert and oriented to person, place, and time.  Psychiatric:        Behavior: Behavior normal.      UC Treatments / Results  Labs (all labs ordered are listed, but only abnormal results are displayed) Labs Reviewed - No data to display  EKG   Radiology No results found.  Procedures Procedures (including critical care time)  Medications Ordered in UC Medications - No data to display  Initial Impression / Assessment and Plan / UC Course  I  have reviewed the triage vital signs and the nursing notes.  Pertinent labs & imaging results that were available during my care of the patient were reviewed by me and considered in my medical decision making (see chart for details).      Patient's primary language is Publishing rights manager, difficult to find an interpreter by video or phone.  Communication seems to be fairly good with her in [place] and with her friend helping.  I have discussed with them and they are understanding that I cannot prescribe controlled substances for chronic pain here in the urgent care clinic.  I have recommended Tylenol  as needed.  At this time I do not feel that it is reasonable to prescribe NSAIDs as her most recent creatinine I found showed reduced kidney function.  I have sent in a small quantity of baclofen  for her to try.  Also topical pain relief is recommended.  Staff has made her a new primary care appointment.  A short course of prednisone  is sent in for hopefully some short-term benefit.  I have discussed with him that this cannot be used for chronic pain control either. Final Clinical Impressions(s) / UC Diagnoses   Final diagnoses:  Myalgia     Discharge Instructions      Take prednisone  20 mg--2 daily for 5 days; this is sent in for possible arthritis.  Take baclofen  10 mg--1 tablet every 12 hours as needed for muscle spasm or muscle pain.  This medication could cause drowsiness or dizziness  You can try alternating heat or ice to your painful areas.  Also Biofreeze ointment can help.  Tylenol  is safe to take for pain  Follow-up with the primary care about this issue.      ED Prescriptions     Medication Sig Dispense Auth. Provider   predniSONE  (DELTASONE ) 20 MG tablet Take 2 tablets (40 mg total) by mouth daily with breakfast for 5 days. 10 tablet Ann Keto, MD   baclofen  (LIORESAL ) 10 MG tablet Take 1 tablet (10 mg total) by mouth every 12 (twelve) hours as needed for muscle  spasms. 20 each Ann Keto, MD      I have reviewed the PDMP during this encounter.   Ann Keto, MD 06/12/23 [phone]

## 2023-06-12 NOTE — Discharge Instructions (Addendum)
 Take prednisone  20 mg--2 daily for 5 days; this is sent in for possible arthritis.  Take baclofen  10 mg--1 tablet every 12 hours as needed for muscle spasm or muscle pain.  This medication could cause drowsiness or dizziness  You can try alternating heat or ice to your painful areas.  Also Biofreeze ointment can help.  Tylenol  is safe to take for pain  Follow-up with the primary care about this issue.

## 2023-06-12 NOTE — ED Triage Notes (Signed)
 Pt states pain all over her body for the past 2 years.  Also states she needs a refill on her tramadol .

## 2023-06-25 ENCOUNTER — Encounter (HOSPITAL_COMMUNITY): Payer: Self-pay

## 2023-06-25 ENCOUNTER — Ambulatory Visit (HOSPITAL_COMMUNITY)
Admission: EM | Admit: 2023-06-25 | Discharge: 2023-06-25 | Disposition: A | Discharge status: Home / Self Care | Attending: Emergency Medicine | Admitting: Emergency Medicine

## 2023-06-25 DIAGNOSIS — G8929 Other chronic pain: Secondary | ICD-10-CM | POA: Diagnosis not present

## 2023-06-25 DIAGNOSIS — I10 Essential (primary) hypertension: Secondary | ICD-10-CM

## 2023-06-25 DIAGNOSIS — E119 Type 2 diabetes mellitus without complications: Secondary | ICD-10-CM

## 2023-06-25 DIAGNOSIS — R5383 Other fatigue: Secondary | ICD-10-CM

## 2023-06-25 DIAGNOSIS — Z7984 Long term (current) use of oral hypoglycemic drugs: Secondary | ICD-10-CM

## 2023-06-25 DIAGNOSIS — E785 Hyperlipidemia, unspecified: Secondary | ICD-10-CM

## 2023-06-25 DIAGNOSIS — Z760 Encounter for issue of repeat prescription: Secondary | ICD-10-CM | POA: Diagnosis not present

## 2023-06-25 LAB — POC COVID19/FLU A&B COMBO
Covid Antigen, POC: NEGATIVE
Influenza A Antigen, POC: NEGATIVE
Influenza B Antigen, POC: NEGATIVE

## 2023-06-25 MED ORDER — LOSARTAN POTASSIUM 100 MG PO TABS: 100.0000 mg | ORAL_TABLET | Freq: Every day | ORAL | 2 refills | Status: DC

## 2023-06-25 MED ORDER — METFORMIN HCL 500 MG PO TABS: 500.0000 mg | ORAL_TABLET | Freq: Every day | ORAL | 0 refills | Status: DC

## 2023-06-25 MED ORDER — GABAPENTIN 100 MG PO CAPS: 100.0000 mg | ORAL_CAPSULE | Freq: Every day | ORAL | 0 refills | Status: DC

## 2023-06-25 MED ORDER — ATORVASTATIN CALCIUM 20 MG PO TABS: 20.0000 mg | ORAL_TABLET | Freq: Every day | ORAL | 2 refills | Status: DC

## 2023-06-25 MED ORDER — AMLODIPINE BESYLATE 2.5 MG PO TABS
2.5000 mg | ORAL_TABLET | Freq: Every day | ORAL | 2 refills | Status: DC
Start: 2023-06-25 — End: 2023-07-22

## 2023-06-25 NOTE — ED Triage Notes (Signed)
 Patient reports hot flashes and body aches x 4 days. Patient states [facility] dismiss her from their office. Patient would like refills on all her medications.  Med list UTD.

## 2023-06-25 NOTE — Discharge Instructions (Signed)
 I have refilled your amlodipine, losartan , atorvastatin, and metformin and given you 2 additional refills as well. I am only able to provide a few of the gabapentin, so use these sparingly for your pain. Follow-up with your primary care provider that you are established with today. Return here as needed.

## 2023-06-26 NOTE — ED Provider Notes (Signed)
 [facility]    CSN: [id number] Arrival date & time: 06/25/23  1719      History   Chief Complaint Chief Complaint  Patient presents with   Fatigue   Hot Flashes   Medication Refill    HPI Dawn [name] is a 68 y.o. female.   Patient presents with generalized fatigue, body pain, and hot flashes x 4 days.  Patient states that she was recently dismissed from [facility] and ran out of most of her medications approximately 4 days ago.  Patient states that she believes her symptoms are likely related to not having her medications.  Denies cough, congestion, headache, sore throat, shortness of breath, chest pain, abdominal pain, nausea, vomiting, and diarrhea.  Patient has history of hypertension, hyperlipidemia, type 2 diabetes, and chronic pain.  Patient states that she has run out of her amlodipine, losartan , atorvastatin, metformin, and gabapentin.  The history is provided by the patient and medical records. The history is limited by a language barrier. No language interpreter was used Therapist, nutritional interpreter; family friend interpreting).  Medication Refill   Past Medical History:  Diagnosis Date   Diabetes mellitus without complication (HCC)    Elevated BP without diagnosis of hypertension    Hypertension     Patient Active Problem List   Diagnosis Date Noted   Acute hyponatremia 03/06/2020   Numbness 03/06/2020   Emesis 03/06/2020   Epigastric pain 07/09/2019   Myalgia 06/04/2019   Essential hypertension 06/04/2019   Healthcare maintenance 06/04/2019   Hypokalemia 06/04/2019   CHF (congestive heart failure) (HCC) 07/22/2016   Chest pain 07/22/2016    Past Surgical History:  Procedure Laterality Date   CESAREAN SECTION      OB History   No obstetric history on file.      Home Medications    Prior to Admission medications   Medication Sig Start Date End Date Taking? Authorizing Provider  acetaminophen  (TYLENOL ) 500 MG tablet Take  1,000 mg by mouth in the morning and at bedtime.   Yes [provider]  alendronate (FOSAMAX) 70 MG tablet Take 70 mg by mouth once a week. 04/08/23  Yes [provider]  baclofen  (LIORESAL ) 10 MG tablet Take 1 tablet (10 mg total) by mouth every 12 (twelve) hours as needed for muscle spasms. 06/12/23  Yes Banister, Pamela K, MD  diclofenac Sodium (VOLTAREN) 1 % GEL Apply 4 g topically. 11/13/20  Yes [provider]  metFORMIN (GLUCOPHAGE) 500 MG tablet Take 1 tablet (500 mg total) by mouth daily. 06/25/23  Yes Rosevelt Constable, Lenzy Kerschner A, NP  traMADol  (ULTRAM ) 50 MG tablet Take 50 mg by mouth 2 (two) times daily as needed. 05/12/23  Yes [provider]  amLODipine (NORVASC) 2.5 MG tablet Take 1 tablet (2.5 mg total) by mouth daily. 06/25/23   Levora Reas A, NP  atorvastatin (LIPITOR) 20 MG tablet Take 1 tablet (20 mg total) by mouth daily. 06/25/23   Levora Reas A, NP  gabapentin (NEURONTIN) 100 MG capsule Take 1 capsule (100 mg total) by mouth daily. 06/25/23   Levora Reas A, NP  losartan  (COZAAR ) 100 MG tablet Take 1 tablet (100 mg total) by mouth daily. 06/25/23   Karon Packer, NP    Family History Family History  Problem Relation Age of Onset   Chorea Father    Colon cancer Neg Hx    Esophageal cancer Neg Hx    Rectal cancer Neg Hx    Stomach cancer Neg Hx  Social History Social History   Tobacco Use   Smoking status: Former    Types: Cigarettes   Smokeless tobacco: Never   Tobacco comments:    Quit 2 months ago per pt  Vaping Use   Vaping status: Never Used  Substance Use Topics   Alcohol use: Never   Drug use: Never     Allergies   Thiazide-type diuretics   Review of Systems Review of Systems  Per HPI  Physical Exam Triage Vital Signs ED Triage Vitals  Encounter Vitals Group     BP 06/25/23 1833 (!) 157/80     Systolic BP Percentile --      Diastolic BP Percentile --      Pulse Rate 06/25/23 1833 76     Resp  06/25/23 1833 16     Temp 06/25/23 1833 98.4 F (36.9 C)     Temp Source 06/25/23 1833 Oral     SpO2 06/25/23 1833 97 %     Weight --      Height --      Head Circumference --      Peak Flow --      Pain Score 06/25/23 1926 10     Pain Loc --      Pain Education --      Exclude from Growth Chart --    No data found.  Updated Vital Signs BP (!) 157/80 (BP Location: Left Arm)   Pulse 76   Temp 98.4 F (36.9 C) (Oral)   Resp 16   SpO2 97%   Visual Acuity Right Eye Distance:   Left Eye Distance:   Bilateral Distance:    Right Eye Near:   Left Eye Near:    Bilateral Near:     Physical Exam Vitals and nursing note reviewed.  Constitutional:      General: She is awake. She is not in acute distress.    Appearance: Normal appearance. She is well-developed and well-groomed.  Cardiovascular:     Rate and Rhythm: Normal rate and regular rhythm.  Pulmonary:     Effort: Pulmonary effort is normal.     Breath sounds: Normal breath sounds.  Skin:    General: Skin is warm and dry.  Neurological:     General: No focal deficit present.     Mental Status: She is alert and oriented to person, place, and time. Mental status is at baseline.  Psychiatric:        Behavior: Behavior is cooperative.      UC Treatments / Results  Labs (all labs ordered are listed, but only abnormal results are displayed) Labs Reviewed  POC COVID19/FLU A&B COMBO    EKG   Radiology No results found.  Procedures Procedures (including critical care time)  Medications Ordered in UC Medications - No data to display  Initial Impression / Assessment and Plan / UC Course  I have reviewed the triage vital signs and the nursing notes.  Pertinent labs & imaging results that were available during my care of the patient were reviewed by me and considered in my medical decision making (see chart for details).     Patient is well-appearing.  Vitals are stable.  Mildly hypertensive at 157/80.  No  other significant findings upon exam.  COVID and flu testing done as rule out, these were both negative.  Refilled amlodipine, losartan , atorvastatin, and metformin.  Given a few capsules of gabapentin for chronic pain.  Asked clinical staff to help patient get established  with a primary care prior to discharge.  Discussed follow-up and return precautions. Final Clinical Impressions(s) / UC Diagnoses   Final diagnoses:  Encounter for medication refill  Other fatigue  Essential hypertension  Type 2 diabetes mellitus without complication, without long-term current use of insulin (HCC)  Hyperlipidemia, unspecified hyperlipidemia type  Other chronic pain     Discharge Instructions      I have refilled your amlodipine, losartan , atorvastatin, and metformin and given you 2 additional refills as well. I am only able to provide a few of the gabapentin, so use these sparingly for your pain. Follow-up with your primary care provider that you are established with today. Return here as needed.  ED Prescriptions     Medication Sig Dispense Auth. Provider   metFORMIN (GLUCOPHAGE) 500 MG tablet Take 1 tablet (500 mg total) by mouth daily. 1 tablet Levora Reas A, NP   amLODipine (NORVASC) 2.5 MG tablet Take 1 tablet (2.5 mg total) by mouth daily. 30 tablet Levora Reas A, NP   atorvastatin (LIPITOR) 20 MG tablet Take 1 tablet (20 mg total) by mouth daily. 30 tablet Levora Reas A, NP   gabapentin (NEURONTIN) 100 MG capsule Take 1 capsule (100 mg total) by mouth daily. 7 capsule Levora Reas A, NP   losartan  (COZAAR ) 100 MG tablet Take 1 tablet (100 mg total) by mouth daily. 30 tablet Levora Reas A, NP      PDMP not reviewed this encounter.   Levora Reas A, NP 06/26/23 [phone]

## 2023-07-22 ENCOUNTER — Encounter: Payer: Self-pay | Admitting: Internal Medicine

## 2023-07-22 ENCOUNTER — Ambulatory Visit (INDEPENDENT_AMBULATORY_CARE_PROVIDER_SITE_OTHER): Admitting: Internal Medicine

## 2023-07-22 VITALS — BP 118/64 | HR 70 | Temp 98.0°F | Ht 61.0 in | Wt 131.2 lb

## 2023-07-22 DIAGNOSIS — Z789 Other specified health status: Secondary | ICD-10-CM | POA: Insufficient documentation

## 2023-07-22 DIAGNOSIS — G609 Hereditary and idiopathic neuropathy, unspecified: Secondary | ICD-10-CM | POA: Diagnosis not present

## 2023-07-22 DIAGNOSIS — M810 Age-related osteoporosis without current pathological fracture: Secondary | ICD-10-CM | POA: Insufficient documentation

## 2023-07-22 DIAGNOSIS — B351 Tinea unguium: Secondary | ICD-10-CM | POA: Insufficient documentation

## 2023-07-22 DIAGNOSIS — I10 Essential (primary) hypertension: Secondary | ICD-10-CM | POA: Diagnosis not present

## 2023-07-22 DIAGNOSIS — M791 Myalgia, unspecified site: Secondary | ICD-10-CM

## 2023-07-22 DIAGNOSIS — E119 Type 2 diabetes mellitus without complications: Secondary | ICD-10-CM | POA: Insufficient documentation

## 2023-07-22 DIAGNOSIS — E785 Hyperlipidemia, unspecified: Secondary | ICD-10-CM | POA: Diagnosis not present

## 2023-07-22 DIAGNOSIS — E559 Vitamin D deficiency, unspecified: Secondary | ICD-10-CM | POA: Insufficient documentation

## 2023-07-22 DIAGNOSIS — Z79899 Other long term (current) drug therapy: Secondary | ICD-10-CM

## 2023-07-22 DIAGNOSIS — K219 Gastro-esophageal reflux disease without esophagitis: Secondary | ICD-10-CM | POA: Insufficient documentation

## 2023-07-22 DIAGNOSIS — Z862 Personal history of diseases of the blood and blood-forming organs and certain disorders involving the immune mechanism: Secondary | ICD-10-CM | POA: Insufficient documentation

## 2023-07-22 MED ORDER — B-12 1000 MCG SL SUBL: 1.0000 | SUBLINGUAL_TABLET | Freq: Every day | SUBLINGUAL | 3 refills | Status: AC

## 2023-07-22 MED ORDER — METFORMIN HCL 500 MG PO TABS: 500.0000 mg | ORAL_TABLET | Freq: Every day | ORAL | 0 refills | Status: AC

## 2023-07-22 MED ORDER — BACLOFEN 10 MG PO TABS
10.0000 mg | ORAL_TABLET | Freq: Two times a day (BID) | ORAL | 0 refills | Status: DC | PRN
Start: 2023-07-22 — End: 2024-01-05

## 2023-07-22 MED ORDER — GABAPENTIN 600 MG PO TABS
600.0000 mg | ORAL_TABLET | Freq: Three times a day (TID) | ORAL | 3 refills | Status: DC
Start: 2023-07-22 — End: 2023-08-13

## 2023-07-22 MED ORDER — CYANOCOBALAMIN 1000 MCG/ML IJ SOLN
1000.0000 ug | Freq: Once | INTRAMUSCULAR | Status: AC
Start: 2023-07-22 — End: 2023-07-22
  Administered 2023-07-22: 1000 ug via INTRAMUSCULAR

## 2023-07-22 MED ORDER — ERGOCALCIFEROL 1.25 MG (50000 UT) PO CAPS: 50000.0000 [IU] | ORAL_CAPSULE | ORAL | 3 refills | Status: AC

## 2023-07-22 MED ORDER — AMLODIPINE BESYLATE 2.5 MG PO TABS: 2.5000 mg | ORAL_TABLET | Freq: Every day | ORAL | 2 refills | Status: DC

## 2023-07-22 MED ORDER — ATORVASTATIN CALCIUM 40 MG PO TABS
40.0000 mg | ORAL_TABLET | Freq: Every day | ORAL | 3 refills | Status: AC
Start: 2023-07-22 — End: ?

## 2023-07-22 MED ORDER — PANTOPRAZOLE SODIUM 40 MG PO TBEC
40.0000 mg | DELAYED_RELEASE_TABLET | Freq: Every day | ORAL | 3 refills | Status: AC
Start: 2023-07-22 — End: ?

## 2023-07-22 MED ORDER — CYANOCOBALAMIN 1000 MCG/ML IJ SOLN: 1000.0000 ug | Freq: Once | INTRAMUSCULAR | Status: DC

## 2023-07-22 MED ORDER — ALENDRONATE SODIUM 70 MG PO TABS: 70.0000 mg | ORAL_TABLET | ORAL | 3 refills | Status: DC

## 2023-07-22 MED ORDER — LOSARTAN POTASSIUM 100 MG PO TABS: 100.0000 mg | ORAL_TABLET | Freq: Every day | ORAL | 2 refills | Status: DC

## 2023-07-22 MED ORDER — B-12 1000 MCG SL SUBL: 1.0000 | SUBLINGUAL_TABLET | Freq: Every day | SUBLINGUAL | 3 refills | Status: DC

## 2023-07-22 NOTE — Progress Notes (Signed)
 [facility]  Phone: [phone]  - Medical Office Visit -  Visit Date: 07/22/2023 Patient: Dawn [name]   DOB: Feb 18, 1955   68 y.o. Female  MRN: [id number] Patient Care Team: Anthon Kins, MD as PCP - General (Internal Medicine) Today's Health Care Provider: Anthon Kins, MD  ===========================================    Chief Complaint / Reason for Visit: NEW PT (Pt is present to est care with pcp pt used to see [facility] health ) and burning (Burning and hot in chest and from feet and up . Burning everywhere on the body at times.also having some heaviness in her head not headache) \ Background: 68 y.o. female who has CHF (congestive heart failure) (HCC); Chest pain; Myalgia; Primary hypertension; Healthcare maintenance; Hypokalemia; Epigastric pain; Acute hyponatremia; Numbness; Emesis; Gastroesophageal reflux disease without esophagitis; Type 2 diabetes mellitus without complication, without long-term current use of insulin (HCC); Vitamin D deficiency; Osteoporosis; Hyperlipidemia; and Idiopathic neuropathy on their problem list.  Discussed the use of AI scribe software for clinical note transcription with the patient, who gave verbal consent to proceed.  History of Present Illness  68 year old female with heart failure who presents with burning pain and swelling in her lower extremities. She is accompanied by her daughter.  She experiences a hot burning pain that starts from the bottom of her feet and radiates up to her hip, chest, back of the shoulder, and head. She describes a sensation of 'hotness burning' and notes that while she does not have a headache, her head feels very heavy. This pain has persisted for two years.  Gabapentin  provides slight relief for the burning pain but does not alleviate the swelling she experiences from her knee upwards. The swelling sometimes decreases with self-massage. There are no specific triggers for the pain, as it 'hurts  all the time' and is not exacerbated by pressure. Walking and stretching cause some discomfort, but not significant pain.  The burning sensation affects her vision, though she denies double vision. She reports frequent urination.  She has toenail fungus on both feet, with one foot being more affected than the other.  Her current medications include gabapentin , Bosentan, amlodipine , baclofen , metformin , Fosamax, atorvastatin , vitamin D, and Protonix . \ Problem overviews updated today: Problem  Gastroesophageal Reflux Disease Without Esophagitis  Type 2 Diabetes Mellitus Without Complication, Without Long-Term Current Use of Insulin (Hcc)  Vitamin D Deficiency  Osteoporosis  Hyperlipidemia  Idiopathic Neuropathy  Primary Hypertension  \ Medications updated/reviewed: No current outpatient medications on file prior to visit.   No current facility-administered medications on file prior to visit.   Medications Discontinued During This Encounter  Medication Reason   acetaminophen  (TYLENOL ) 500 MG tablet Completed Course   diclofenac Sodium (VOLTAREN) 1 % GEL Completed Course   traMADol  (ULTRAM ) 50 MG tablet Completed Course   atorvastatin  (LIPITOR) 20 MG tablet Completed Course   gabapentin  (NEURONTIN ) 100 MG capsule Completed Course   Cyanocobalamin (B-12) 1000 MCG SUBL    alendronate (FOSAMAX) 70 MG tablet Reorder   baclofen  (LIORESAL ) 10 MG tablet Reorder   metFORMIN  (GLUCOPHAGE ) 500 MG tablet Reorder   amLODipine  (NORVASC ) 2.5 MG tablet Reorder   losartan  (COZAAR ) 100 MG tablet Reorder   gabapentin  (NEURONTIN ) 600 MG tablet Reorder   ergocalciferol (VITAMIN D2) 1.25 MG (50000 UT) capsule Reorder   pantoprazole  (PROTONIX ) 40 MG tablet Reorder   atorvastatin  (LIPITOR) 40 MG tablet Reorder   Current Meds  Medication Sig   Cyanocobalamin (B-12) 1000 MCG SUBL Place 1  tablet under the tongue daily at 6 (six) AM.   [DISCONTINUED] alendronate (FOSAMAX) 70 MG tablet Take 70 mg by  mouth once a week.   [DISCONTINUED] amLODipine  (NORVASC ) 2.5 MG tablet Take 1 tablet (2.5 mg total) by mouth daily.   [DISCONTINUED] atorvastatin  (LIPITOR) 40 MG tablet Take 40 mg by mouth daily.   [DISCONTINUED] baclofen  (LIORESAL ) 10 MG tablet Take 1 tablet (10 mg total) by mouth every 12 (twelve) hours as needed for muscle spasms.   [DISCONTINUED] Cyanocobalamin (B-12) 1000 MCG SUBL Place 1 tablet under the tongue daily at 6 (six) AM.   [DISCONTINUED] ergocalciferol (VITAMIN D2) 1.25 MG (50000 UT) capsule Take 50,000 Units by mouth once a week.   [DISCONTINUED] gabapentin  (NEURONTIN ) 600 MG tablet Take 600 mg by mouth 3 (three) times daily.   [DISCONTINUED] losartan  (COZAAR ) 100 MG tablet Take 1 tablet (100 mg total) by mouth daily.   [DISCONTINUED] metFORMIN  (GLUCOPHAGE ) 500 MG tablet Take 1 tablet (500 mg total) by mouth daily.   [DISCONTINUED] pantoprazole  (PROTONIX ) 40 MG tablet Take 40 mg by mouth daily.   Current Facility-Administered Medications for the 07/22/23 encounter (Office Visit) with Anthon Kins, MD  Medication   cyanocobalamin (VITAMIN B12) injection 1,000 mcg   Allergies:   Allergies as of 07/22/2023 - Review Complete 07/22/2023  Allergen Reaction Noted   Thiazide-type diuretics Other (See Comments) 03/12/2020   Past Medical History:  has a past medical history of Arthritis, Diabetes mellitus without complication (HCC), Elevated BP without diagnosis of hypertension, and Hypertension. Past Surgical History:   has a past surgical history that includes Cesarean section. Social History:   reports that she has quit smoking. Her smoking use included cigarettes. She has never used smokeless tobacco. She reports that she does not drink alcohol and does not use drugs. Family History:  family history includes Chorea in her father. Depression Screen and Health Maintenance:    07/22/2023   10:53 AM 08/09/2019   11:47 AM 06/04/2019   11:26 AM 06/04/2019   11:02 AM  PHQ 2/9  Scores  PHQ - 2 Score 0 3 0 0  PHQ- 9 Score  16 1    Health Maintenance  Topic Date Due   Medicare Annual Wellness (AWV)  Never done   FOOT EXAM  Never done   OPHTHALMOLOGY EXAM  Never done   Diabetic kidney evaluation - Urine ACR  Never done   HEMOGLOBIN A1C  09/03/2020   Diabetic kidney evaluation - eGFR measurement  03/11/2021   DEXA SCAN  Never done   MAMMOGRAM  06/21/2021   Zoster Vaccines- Shingrix (2 of 2) 09/26/2021   COVID-19 Vaccine (3 - 2024-25 season) 08/07/2023 (Originally 10/13/2022)   INFLUENZA VACCINE  09/12/2023   Colonoscopy  06/28/2026   DTaP/Tdap/Td (2 - Td or Tdap) 08/02/2031   Pneumonia Vaccine 7+ Years old  Completed   Hepatitis C Screening  Completed   HPV VACCINES  Aged Out   Meningococcal B Vaccine  Aged Out   Immunization History  Administered Date(s) Administered   Influenza, Seasonal, Injecte, Preservative Fre 11/26/2019, 01/16/2021   PFIZER(Purple Top)SARS-COV-2 Vaccination 09/15/2019, 07/06/2020   PNEUMOCOCCAL CONJUGATE-20 08/01/2021   Tdap 08/01/2021   Zoster Recombinant(Shingrix) 08/01/2021     Objective   Physical ExamBP 118/64   Pulse 70   Temp 98 F (36.7 C) (Temporal)   Ht 5\' 1"  (1.549 m)   Wt 131 lb 3.2 oz (59.5 kg)   SpO2 98%   BMI 24.79 kg/m  Wt Readings from  Last 10 Encounters:  07/22/23 131 lb 3.2 oz (59.5 kg)  03/13/20 115 lb 15.4 oz (52.6 kg)  08/09/19 118 lb 9.6 oz (53.8 kg)  07/09/19 119 lb 6.4 oz (54.2 kg)  06/28/19 124 lb (56.2 kg)  06/21/19 124 lb (56.2 kg)  06/04/19 124 lb 6.4 oz (56.4 kg)  05/19/19 140 lb (63.5 kg)  08/27/16 142 lb (64.4 kg)  Vital signs reviewed.  Nursing notes reviewed. Weight trend reviewed. Abnormalities and problem-specific physical exam findings:  cannot walk heel to toe, no obvious swelling where she feels swelling in legs.  Excellent pulse to feet, no tenderness anywhere, just burning pain everywhere with no associated rash. Does not speak english. General Appearance:  Well developed,  well nourished, well-groomed, healthy-appearing female with Body mass index is 24.79 kg/m. No acute distress appreciable.   Skin: Clear and well-hydrated. Pulmonary:  Normal work of breathing at rest, no respiratory distress apparent. SpO2: 98 %  Musculoskeletal: She demonstrates smooth and coordinated movements throughout all major joints.All extremities are intact.  Neurological:  Awake, alert, oriented, and engaged.  No obvious focal neurological deficits or cognitive impairments.  Sensorium seems unclouded.  Psychiatric:  Appropriate mood, pleasant and cooperative demeanor, cheerful and engaged during the exam  Last CBC Lab Results  Component Value Date   WBC 8.8 03/09/2020   HGB 9.7 (L) 03/09/2020   HCT 26.4 (L) 03/09/2020   MCV 75.9 (L) 03/09/2020   MCH 27.9 03/09/2020   RDW 12.6 03/09/2020   PLT 226 03/09/2020   Last metabolic panel Lab Results  Component Value Date   GLUCOSE 111 (H) 03/11/2020   NA 132 (L) 03/12/2020   K 4.3 03/11/2020   CL 97 (L) 03/11/2020   CO2 24 03/11/2020   BUN 5 (L) 03/11/2020   CREATININE 0.87 03/11/2020   GFRNONAA >60 03/11/2020   CALCIUM  8.9 03/11/2020   PHOS 2.4 (L) 03/08/2020   PROT 7.0 03/06/2020   ALBUMIN 3.7 03/07/2020   BILITOT 0.9 03/06/2020   ALKPHOS 81 03/06/2020   AST 30 03/06/2020   ALT 10 03/06/2020   ANIONGAP 10 03/11/2020   Last lipids Lab Results  Component Value Date   LDLDIRECT 81.0 06/04/2019   Last hemoglobin A1c Lab Results  Component Value Date   HGBA1C 4.6 (L) 03/06/2020   Last thyroid functions Lab Results  Component Value Date   TSH 0.513 03/06/2020   Last vitamin D No results found for: "25OHVITD2", "25OHVITD3", "VD25OH" Last vitamin B12 and Folate No results found for: "VITAMINB12", "FOLATE"     No results found for any visits on 07/22/23.  Admission on 06/25/2023, Discharged on 06/25/2023  Component Date Value   Influenza A Antigen, POC 06/25/2023 Negative    Influenza B Antigen, POC  06/25/2023 Negative    Covid Antigen, POC 06/25/2023 Negative         Assessment & Plan Idiopathic neuropathy She experiences chronic burning pain and swelling in her lower extremities, from feet to hip, with heavy head and vision changes. Symptoms have persisted for two years. Gabapentin  provides minimal relief. B12 deficiency is suspected as the primary cause, with other possibilities including heavy metal poisoning, Lyme disease, and various vitamin deficiencies, thyroid dysfunction, diabetes, liver and kidney issues, myeloma, lupus, syphilis, and HIV. B12 deficiency may result from dietary lack or autoimmune absorption issues. Discussed risks and benefits of B12 supplementation, noting sublingual tablets are less effective than injections due to absorption issues. Estimated out-of-pocket expense for lab work and treatment is $200-$300 after insurance.  Administer B12 injection after lab work and prescribe sublingual B12 tablet. Order lab tests for B12, vitamin D, folic acid, thiamine, thyroid function, diabetes, liver and kidney function, magnesium , blood counts, myeloma, lupus, syphilis, and HIV. Discuss financial implications of lab work and treatment options. Refill current medications including Bosentan, amlodipine , baclofen , metformin , Fosamax, atorvastatin , vitamin D, gabapentin , and Protonix .  Potential B12 deficiency due to dietary lack. She is not a vegetarian, but her dietary intake of B12 may be insufficient. Discuss dietary sources of B12 and potential need for dietary adjustments. Primary hypertension Refilled continues on, as of 07/22/2023 Current hypertension medications:       Sig   amLODipine  (NORVASC ) 2.5 MG tablet Take 1 tablet (2.5 mg total) by mouth daily.   losartan  (COZAAR ) 100 MG tablet Take 1 tablet (100 mg total) by mouth daily.     Well-controlled  Hyperlipidemia, unspecified hyperlipidemia type Refilled medication(s)  Myalgia Refills medication(s)  Osteoporosis,  unspecified osteoporosis type, unspecified pathological fracture presence Refills vitamin D fosamax Vitamin D deficiency Will order lab testing to guide management. Type 2 diabetes mellitus without complication, without long-term current use of insulin (HCC) A1c 4.6 so maybe this metformin  is for weight loss. Gastroesophageal reflux disease without esophagitis  Language barrier to communication Used interpreter, she speaks [place] vietnamese only, very difficult to find Nurse, learning disability. Onychomycosis Fungal infection is present in toenails, more severe in one foot than the other. History of anemia   Diagnoses and all orders for this visit: Idiopathic neuropathy -     cyanocobalamin (VITAMIN B12) injection 1,000 mcg -     Vitamin B1; Future -     Comprehensive metabolic panel with GFR; Future -     CBC with Differential/Platelet; Future -     TSH Rfx on Abnormal to Free T4; Future -     Hemoglobin A1c; Future -     HIV Antibody (routine testing w rflx); Future -     HCV Ab w Reflex to Quant PCR; Future -     RPR; Future -     Lyme Disease Serology w/Reflex; Future -     ANA w/Reflex if Positive; Future -     Sedimentation rate; Future -     C-reactive protein; Future -     Magnesium ; Future -     Methylmalonic Acid; Future -     Homocysteine; Future -     B12 and Folate Panel; Future -     Protein Electrophoresis, (serum); Future -     Methylmalonic acid, serum -     B12 -     Cyanocobalamin (B-12) 1000 MCG SUBL; Place 1 tablet under the tongue daily at 6 (six) AM. -     gabapentin  (NEURONTIN ) 600 MG tablet; Take 1 tablet (600 mg total) by mouth 3 (three) times daily. Essential hypertension -     losartan  (COZAAR ) 100 MG tablet; Take 1 tablet (100 mg total) by mouth daily. Primary hypertension -     amLODipine  (NORVASC ) 2.5 MG tablet; Take 1 tablet (2.5 mg total) by mouth daily. Hyperlipidemia, unspecified hyperlipidemia type -     atorvastatin  (LIPITOR) 40 MG tablet; Take 1  tablet (40 mg total) by mouth daily. Myalgia -     baclofen  (LIORESAL ) 10 MG tablet; Take 1 tablet (10 mg total) by mouth every 12 (twelve) hours as needed for muscle spasms. Osteoporosis, unspecified osteoporosis type, unspecified pathological fracture presence -     alendronate (FOSAMAX) 70 MG tablet; Take 1 tablet (70 mg  total) by mouth once a week. Vitamin D deficiency -     ergocalciferol (VITAMIN D2) 1.25 MG (50000 UT) capsule; Take 1 capsule (50,000 Units total) by mouth once a week. -     Vitamin D (25 hydroxy) Type 2 diabetes mellitus without complication, without long-term current use of insulin (HCC) -     metFORMIN  (GLUCOPHAGE ) 500 MG tablet; Take 1 tablet (500 mg total) by mouth daily. Gastroesophageal reflux disease without esophagitis -     pantoprazole  (PROTONIX ) 40 MG tablet; Take 1 tablet (40 mg total) by mouth daily.   Recommended follow up: Return in about 3 weeks (around 08/12/2023) for close follow up acute illness.No future appointments.       Additional notes: This document was synthesized by artificial intelligence ([employer]bridge) using HIPAA-compliant recording of the clinical interaction;   We discussed the use of AI scribe software for clinical note transcription with the patient, who gave verbal consent to proceed.    Additional Info: This encounter employed state-of-the-art, real-time, collaborative documentation. The patient actively reviewed and assisted in updating their electronic medical record on a shared screen, ensuring transparency and facilitating joint problem-solving for the problem list, overview, and plan. This approach promotes accurate, informed care. The treatment plan was discussed and reviewed in detail, including medication safety, potential side effects, and all patient questions. We confirmed understanding and comfort with the plan. Follow-up instructions were established, including contacting the office for any concerns, returning if symptoms worsen,  persist, or new symptoms develop, and precautions for potential emergency department visits.  Initial Appointment Goals:  This initial visit focused on establishing a foundation for the patient's care. We collaboratively reviewed her medical history and medications in detail, updating the chart as shown in the encounter. Given the extensive information, we prioritized addressing her most pressing concerns, which she reported were: NEW PT (Pt is present to est care with pcp pt used to see [facility] health ) and burning (Burning and hot in chest and from feet and up . Burning everywhere on the body at times.also having some heaviness in her head not headache)  While the complexity of the patient's medical picture may necessitate further evaluation in subsequent visits, we were able to develop a preliminary care plan together. To expedite a comprehensive plan at the next visit, we encouraged the patient to gather relevant medical records from previous providers. This collaborative approach will ensure a more complete understanding of the patient's health and inform the development of a personalized care plan. We look forward to continuing the conversation and working together with the patient on achieving her health goals.   Collaborative Documentation:  Today's encounter utilized real-time, dynamic patient engagement.  Patients actively participate by directly reviewing and assisting in updating their medical records through a shared screen. This transparency empowers patients to visually confirm chart updates made by the healthcare provider.  This collaborative approach facilitates problem management as we jointly update the problem list, problem overview, and assessment/plan. Ultimately, this process enhances chart accuracy and completeness, fostering shared decision-making, patient education, and informed consent for tests and treatments.  Collaborative Treatment Planning:  Treatment plans were discussed  and reviewed in detail.  Explained medication safety and potential side effects.  Encouraged participation and answered all patient questions, confirming understanding and comfort with the plan. Encouraged patient to contact our office if they have any questions or concerns. Agreed on patient returning to office if symptoms worsen, persist, or new symptoms develop.  ----------------------------------------------------- Anthon Kins, MD  07/22/2023 11:51 AM  [facility]:  [phone]

## 2023-07-22 NOTE — Assessment & Plan Note (Signed)
 Used interpreter, she speaks [place] viet[place] only, very difficult to find Nurse, learning disability.

## 2023-07-22 NOTE — Assessment & Plan Note (Signed)
 Refilled medications

## 2023-07-22 NOTE — Assessment & Plan Note (Signed)
 Fungal infection is present in toenails, more severe in one foot than the other.

## 2023-07-22 NOTE — Assessment & Plan Note (Signed)
 She experiences chronic burning pain and swelling in her lower extremities, from feet to hip, with heavy head and vision changes. Symptoms have persisted for two years. Gabapentin  provides minimal relief. B12 deficiency is suspected as the primary cause, with other possibilities including heavy metal poisoning, Lyme disease, and various vitamin deficiencies, thyroid dysfunction, diabetes, liver and kidney issues, myeloma, lupus, syphilis, and HIV. B12 deficiency may result from dietary lack or autoimmune absorption issues. Discussed risks and benefits of B12 supplementation, noting sublingual tablets are less effective than injections due to absorption issues. Estimated out-of-pocket expense for lab work and treatment is $200-$300 after insurance. Administer B12 injection after lab work and prescribe sublingual B12 tablet. Order lab tests for B12, vitamin D, folic acid, thiamine, thyroid function, diabetes, liver and kidney function, magnesium , blood counts, myeloma, lupus, syphilis, and HIV. Discuss financial implications of lab work and treatment options. Refill current medications including Bosentan, amlodipine , baclofen , metformin , Fosamax, atorvastatin , vitamin D, gabapentin , and Protonix .  Potential B12 deficiency due to dietary lack. She is not a vegetarian, but her dietary intake of B12 may be insufficient. Discuss dietary sources of B12 and potential need for dietary adjustments.

## 2023-07-22 NOTE — Assessment & Plan Note (Signed)
 Will order lab testing to guide management.

## 2023-07-22 NOTE — Assessment & Plan Note (Signed)
 Refills vitamin D fosamax

## 2023-07-22 NOTE — Patient Instructions (Signed)
 Welcome aboard!   Today's visit was a valuable first step in understanding your health and starting your personalized care journey. We discussed your medical history and medications in detail. Given the extensive information, we prioritized addressing your most pressing concerns.  We understood those concerns to be:  NEW PT (Pt is present to est care with pcp pt used to see [facility] ) and burning (Burning and hot in chest and from feet and up . Burning everywhere on the body at times.also having some heaviness in her head not headache)   Building a Complete Picture  To create the most effective care plan possible, we may need additional information from previous providers. We encouraged you to gather any relevant medical records for your next visit. This will help us  build a more complete picture and develop a personalized plan together. In the meantime, we'll address your immediate concerns and provide resources to help you manage all of your medical issues.  We encourage you to use MyChart to review these efforts, and to help us  find and correct any omissions or errors in your medical chart.  Managing Your Health Over Time  Managing every aspect of your health in a single visit isn't always feasible, but that's okay.  We addressed your most pressing concerns today and charted a course for future care. Acute conditions or preventive care measures may require further attention.  We encourage you to schedule a follow-up visit at your earliest convenience to discuss any unresolved issues.  We strongly encourage participation in annual preventive care visits to help us  develop a more thorough understanding of your health and to help you maintain optimal wellness - please inquire about scheduling your next one with us  at your earliest convenience.  Your Satisfaction Matters  It was a pleasure seeing you today!  Your health and satisfaction will always be my top priorities. If you believe your  experience today was worthy of a 5-star rating, I'd be grateful for your feedback!  Anthon Kins, MD   Next Steps  Schedule Follow-Up:  We recommend a follow-up appointment in Return in about 3 weeks (around 08/12/2023) for close follow up acute illness. If your condition worsens before then, please call us  or seek emergency care. Preventive Care:  Don't forget to schedule your annual preventive care visit!  This important checkup is typically covered by insurance and helps identify potential health issues early.  Typically its 100% insurance covered with no co-pay and helps to get surveillance labwork paid for through your insurance provider.  Sometimes it even lowers your insurance premiums to participate. Medical Information Release:  For any relevant medical information we don't have, please sign a release form so we can obtain it for your records. Lab & X-ray Appointments:  Scheduled any incomplete lab tests today or call us  to schedule.  X-Rays can be done without an appointment at [facility] at [facility] ([address], [address]), M-F 8:30am-noon or 1pm-5pm.  Just tell them you're there for X-rays ordered by Dr. Boston Byers.  We'll receive the results and contact you by phone or MyChart to discuss next steps.  Bring to Your Next Appointment  Medications: Please bring all your medication bottles to your next appointment to ensure we have an accurate record of your prescriptions. Health Diaries: If you're monitoring any health conditions at home, keeping a diary of your readings can be very helpful for discussions at your next appointment.  Reviewing Your Records  Please Review this early  draft of your clinical notes below and the final encounter summary tomorrow on MyChart after its been completed.   Idiopathic neuropathy -     Cyanocobalamin -     Vitamin B1; Future -     Comprehensive metabolic panel with GFR; Future -     CBC with Differential/Platelet; Future -     TSH Rfx on  Abnormal to Free T4; Future -     Hemoglobin A1c; Future -     HIV Antibody (routine testing w rflx); Future -     HCV Ab w Reflex to Quant PCR; Future -     RPR; Future -     Lyme Disease Serology w/Reflex; Future -     ANA w/Reflex if Positive; Future -     Sedimentation rate; Future -     C-reactive protein; Future -     Magnesium ; Future -     Methylmalonic acid, serum; Future -     Homocysteine; Future -     B12 and Folate Panel; Future -     Protein electrophoresis, serum; Future -     Methylmalonic acid, serum -     Vitamin B12 -     B-12; Place 1 tablet under the tongue daily at 6 (six) AM.  Dispense: 90 tablet; Refill: 3 -     Gabapentin ; Take 1 tablet (600 mg total) by mouth 3 (three) times daily.  Dispense: 90 tablet; Refill: 3  Essential hypertension -     Losartan  Potassium; Take 1 tablet (100 mg total) by mouth daily.  Dispense: 30 tablet; Refill: 2  Primary hypertension -     amLODIPine  Besylate; Take 1 tablet (2.5 mg total) by mouth daily.  Dispense: 30 tablet; Refill: 2  Hyperlipidemia, unspecified hyperlipidemia type -     Atorvastatin  Calcium ; Take 1 tablet (40 mg total) by mouth daily.  Dispense: 90 tablet; Refill: 3  Myalgia -     Baclofen ; Take 1 tablet (10 mg total) by mouth every 12 (twelve) hours as needed for muscle spasms.  Dispense: 20 each; Refill: 0  Osteoporosis, unspecified osteoporosis type, unspecified pathological fracture presence -     Alendronate Sodium; Take 1 tablet (70 mg total) by mouth once a week.  Dispense: 13 tablet; Refill: 3  Vitamin D deficiency -     Ergocalciferol; Take 1 capsule (50,000 Units total) by mouth once a week.  Dispense: 13 capsule; Refill: 3 -     VITAMIN D 25 Hydroxy (Vit-D Deficiency, Fractures)  Type 2 diabetes mellitus without complication, without long-term current use of insulin (HCC) -     metFORMIN  HCl; Take 1 tablet (500 mg total) by mouth daily.  Dispense: 1 tablet; Refill: 0  Gastroesophageal reflux  disease without esophagitis -     Pantoprazole  Sodium; Take 1 tablet (40 mg total) by mouth daily.  Dispense: 90 tablet; Refill: 3     Getting Answers and Following Up  Simple Questions & Concerns: For quick questions or basic follow-up after your visit, reach us  at [phone] or MyChart messaging. Complex Concerns: If your concern is more complex, scheduling an appointment might be best. Discuss this with the staff to find the most suitable option. Lab & Imaging Results: We'll contact you directly if results are abnormal or you don't use MyChart. Most normal results will be on MyChart within 2-3 business days, with a review message from Dr. Boston Byers. Haven't heard back in 2 weeks? Need results sooner? Contact us  at [phone].  Referrals: Our referral coordinator will manage specialist referrals. The specialist's office should contact you within 2 weeks to schedule an appointment. Call us  if you haven't heard from them after 2 weeks.  Staying Connected  MyChart: Activate your MyChart for the fastest way to access results and message us . See the last page of this paperwork for instructions.  Billing  X-ray & Lab Orders: These are billed by separate companies. Contact the invoicing company directly for questions or concerns. Visit Charges: Discuss any billing inquiries with our administrative services team.  Feedback & Satisfaction  Share Your Experience: We strive for your satisfaction! If you have any complaints, please let Dr. Boston Byers know directly or contact our Practice Administrators, Olinda Bertrand or [employer], by asking at the front desk.  Scheduling Tips  Shorter Wait Times: 8 am and 1 pm appointments often have the quickest wait times. Longer Appointments: If you need more time during your visit, talk to the front desk. Due to insurance regulations, multiple back-to-back appointments might be necessary.

## 2023-07-22 NOTE — Assessment & Plan Note (Signed)
 Refilled continues on, as of 07/22/2023 Current hypertension medications:       Sig   amLODipine  (NORVASC ) 2.5 MG tablet Take 1 tablet (2.5 mg total) by mouth daily.   losartan  (COZAAR ) 100 MG tablet Take 1 tablet (100 mg total) by mouth daily.     Well-controlled

## 2023-07-22 NOTE — Assessment & Plan Note (Signed)
 A1c 4.6 so maybe this metformin  is for weight loss.

## 2023-07-22 NOTE — Assessment & Plan Note (Signed)
Refills medications.

## 2023-07-24 LAB — VITAMIN B12: Vitamin B-12: 309 pg/mL (ref 211–911)

## 2023-07-24 LAB — VITAMIN D 25 HYDROXY (VIT D DEFICIENCY, FRACTURES): VITD: 35.22 ng/mL (ref 30.00–100.00)

## 2023-07-25 ENCOUNTER — Ambulatory Visit: Payer: Self-pay | Admitting: Internal Medicine

## 2023-07-25 NOTE — Progress Notes (Signed)
 It says B12 is normal it says B12 is normal but it is actually very low normal and probably has been much lower in the past I still think there is a high likelihood of the problems are coming from B12

## 2023-07-26 LAB — METHYLMALONIC ACID, SERUM: Methylmalonic Acid, Quant: 131 nmol/L (ref 69–390)

## 2023-08-13 ENCOUNTER — Ambulatory Visit (INDEPENDENT_AMBULATORY_CARE_PROVIDER_SITE_OTHER)

## 2023-08-13 ENCOUNTER — Encounter: Payer: Self-pay | Admitting: Internal Medicine

## 2023-08-13 ENCOUNTER — Ambulatory Visit (INDEPENDENT_AMBULATORY_CARE_PROVIDER_SITE_OTHER): Admitting: Internal Medicine

## 2023-08-13 VITALS — BP 120/62 | HR 75 | Temp 98.0°F | Ht 61.0 in | Wt 134.6 lb

## 2023-08-13 DIAGNOSIS — Z789 Other specified health status: Secondary | ICD-10-CM

## 2023-08-13 DIAGNOSIS — M79671 Pain in right foot: Secondary | ICD-10-CM

## 2023-08-13 DIAGNOSIS — G609 Hereditary and idiopathic neuropathy, unspecified: Secondary | ICD-10-CM | POA: Diagnosis not present

## 2023-08-13 DIAGNOSIS — M25579 Pain in unspecified ankle and joints of unspecified foot: Secondary | ICD-10-CM

## 2023-08-13 DIAGNOSIS — M79672 Pain in left foot: Secondary | ICD-10-CM | POA: Diagnosis not present

## 2023-08-13 DIAGNOSIS — Z119 Encounter for screening for infectious and parasitic diseases, unspecified: Secondary | ICD-10-CM

## 2023-08-13 DIAGNOSIS — H538 Other visual disturbances: Secondary | ICD-10-CM | POA: Diagnosis not present

## 2023-08-13 DIAGNOSIS — M818 Other osteoporosis without current pathological fracture: Secondary | ICD-10-CM

## 2023-08-13 DIAGNOSIS — M79673 Pain in unspecified foot: Secondary | ICD-10-CM

## 2023-08-13 DIAGNOSIS — M791 Myalgia, unspecified site: Secondary | ICD-10-CM

## 2023-08-13 LAB — CBC WITH DIFFERENTIAL/PLATELET
Basophils Absolute: 0.1 10*3/uL (ref 0.0–0.1)
Basophils Relative: 0.7 % (ref 0.0–3.0)
Eosinophils Absolute: 0 10*3/uL (ref 0.0–0.7)
Eosinophils Relative: 0.2 % (ref 0.0–5.0)
HCT: 37 % (ref 36.0–46.0)
Hemoglobin: 12 g/dL (ref 12.0–15.0)
Lymphocytes Relative: 34.2 % (ref 12.0–46.0)
Lymphs Abs: 2.5 10*3/uL (ref 0.7–4.0)
MCHC: 32.5 g/dL (ref 30.0–36.0)
MCV: 83.2 fl (ref 78.0–100.0)
Monocytes Absolute: 0.4 10*3/uL (ref 0.1–1.0)
Monocytes Relative: 5.2 % (ref 3.0–12.0)
Neutro Abs: 4.3 10*3/uL (ref 1.4–7.7)
Neutrophils Relative %: 59.7 % (ref 43.0–77.0)
Platelets: 231 10*3/uL (ref 150.0–400.0)
RBC: 4.45 Mil/uL (ref 3.87–5.11)
RDW: 14.4 % (ref 11.5–15.5)
WBC: 7.2 10*3/uL (ref 4.0–10.5)

## 2023-08-13 LAB — COMPREHENSIVE METABOLIC PANEL WITH GFR
ALT: 12 U/L (ref 0–35)
AST: 22 U/L (ref 0–37)
Albumin: 4.3 g/dL (ref 3.5–5.2)
Alkaline Phosphatase: 57 U/L (ref 39–117)
BUN: 10 mg/dL (ref 6–23)
CO2: 28 meq/L (ref 19–32)
Calcium: 9.2 mg/dL (ref 8.4–10.5)
Chloride: 104 meq/L (ref 96–112)
Creatinine, Ser: 0.91 mg/dL (ref 0.40–1.20)
GFR: 65.37 mL/min (ref >60.00)
Glucose, Bld: 135 mg/dL — ABNORMAL HIGH (ref 70–99)
Potassium: 3.9 meq/L (ref 3.5–5.1)
Sodium: 139 meq/L (ref 135–145)
Total Bilirubin: 0.4 mg/dL (ref 0.2–1.2)
Total Protein: 7.7 g/dL (ref 6.0–8.3)

## 2023-08-13 LAB — SEDIMENTATION RATE: Sed Rate: 31 mm/h — ABNORMAL HIGH (ref 0–30)

## 2023-08-13 LAB — C-REACTIVE PROTEIN: CRP: <1 mg/dL (ref 0.5–20.0)

## 2023-08-13 LAB — MAGNESIUM: Magnesium: 2.1 mg/dL (ref 1.5–2.5)

## 2023-08-13 LAB — B12 AND FOLATE PANEL
Folate: 12.9 ng/mL (ref >5.9)
Vitamin B-12: 910 pg/mL (ref 211–911)

## 2023-08-13 LAB — HEMOGLOBIN A1C: Hgb A1c MFr Bld: 5.9 % (ref 4.6–6.5)

## 2023-08-13 MED ORDER — LIDOCAINE-PRILOCAINE 2.5-2.5 % EX CREA: TOPICAL_CREAM | Freq: Two times a day (BID) | CUTANEOUS | 0 refills | Status: AC

## 2023-08-13 MED ORDER — LIDOCAINE-PRILOCAINE 2.5-2.5 % EX CREA
1.0000 | TOPICAL_CREAM | CUTANEOUS | 0 refills | Status: AC | PRN
Start: 2023-08-13 — End: ?

## 2023-08-13 MED ORDER — CYANOCOBALAMIN 1000 MCG/ML IJ SOLN: 1000.0000 ug | Freq: Once | INTRAMUSCULAR | Status: AC

## 2023-08-13 MED ORDER — PREGABALIN 200 MG PO CAPS: 200.0000 mg | ORAL_CAPSULE | Freq: Two times a day (BID) | ORAL | 1 refills | Status: DC

## 2023-08-13 NOTE — Progress Notes (Signed)
 ==============================  [facility]: [phone]   -- Medical Office Visit --  Patient: Dawn [name]      Age: 68 y.o.       Sex:  female  Date:   08/13/2023 Today's Healthcare Provider: Bernardino KANDICE Cone, MD  ==============================   Chief Complaint: follow up Idiopathic neuropathy and Abdominal Pain (Still having a lot pains in head as well as  back and legs. Bottoms of both feet warm heat feeling .)  Discussed the use of AI scribe software for clinical note transcription with the patient, who gave verbal consent to proceed.  History of Present Illness 68 year old female who presents with bilateral foot pain and radiating pain to the back, stomach, and head. 3 week(s) follow up we had planned to review labwork but most of it was lost/incomplete so N/A yet.  Seems the lab only did one of the many tests ordered due to a logistics/language misunderstanding.  She experiences pain and a sensation of heat in both feet, with the right foot being more severely affected than the left. The pain is located between the heel and the ankle, extending along the plantar fascia, and has persisted for approximately five to six years. She attributes this pain to prolonged standing and working.  The pain radiates from the feet upwards to the legs, back, stomach, and occasionally to the head, described as 'feeling deep' and occurring simultaneously in these areas. Additionally, she experiences numbness in her hands. No numbness in the feet.  She takes gabapentin  every morning, which provides some relief, and B12 supplements, which have led to slight improvement in her symptoms. She also uses alendronate  for bone health.  She reports a burning sensation in her eyes, accompanied by blurry vision. She also describes pain in both hips, particularly near the top of the buttocks, and notes a sensation of swelling in the veins.  Previous blood work was incomplete,  with only B12 levels recorded. She has not seen a neurologist before, but past x-rays indicated some bone issues, for which she was prescribed medication.   Background Reviewed: Problem List: has CHF (congestive heart failure) (HCC); Chest pain; Myalgia; Primary hypertension; Healthcare maintenance; Hypokalemia; Epigastric pain; Acute hyponatremia; Numbness; Emesis; Gastroesophageal reflux disease without esophagitis; Vitamin D  deficiency; Osteoporosis; Hyperlipidemia; Idiopathic neuropathy; History of anemia; Language barrier to communication; and Onychomycosis on their problem list. Past Medical History:  has a past medical history of Arthritis, Diabetes mellitus without complication (HCC), Elevated BP without diagnosis of hypertension, and Hypertension. Past Surgical History:   has a past surgical history that includes Cesarean section. Social History:   reports that she has quit smoking. Her smoking use included cigarettes. She has never used smokeless tobacco. She reports that she does not drink alcohol and does not use drugs. Family History:  family history includes Chorea in her father. Allergies:  is allergic to thiazide-type diuretics.   Medication Reconciliation: Current Outpatient Medications on File Prior to Visit  Medication Sig   alendronate  (FOSAMAX ) 70 MG tablet Take 1 tablet (70 mg total) by mouth once a week.   amLODipine  (NORVASC ) 2.5 MG tablet Take 1 tablet (2.5 mg total) by mouth daily.   atorvastatin  (LIPITOR) 40 MG tablet Take 1 tablet (40 mg total) by mouth daily.   baclofen  (LIORESAL ) 10 MG tablet Take 1 tablet (10 mg total) by mouth every 12 (twelve) hours as needed for muscle spasms.   Cyanocobalamin  (B-12) 1000 MCG SUBL Place 1 tablet under  the tongue daily at 6 (six) AM.   ergocalciferol  (VITAMIN D2) 1.25 MG (50000 UT) capsule Take 1 capsule (50,000 Units total) by mouth once a week.   losartan  (COZAAR ) 100 MG tablet Take 1 tablet (100 mg total) by mouth daily.    metFORMIN  (GLUCOPHAGE ) 500 MG tablet Take 1 tablet (500 mg total) by mouth daily.   pantoprazole  (PROTONIX ) 40 MG tablet Take 1 tablet (40 mg total) by mouth daily.   No current facility-administered medications on file prior to visit.   Medications Discontinued During This Encounter  Medication Reason   gabapentin  (NEURONTIN ) 600 MG tablet      Physical Exam:    08/13/2023   10:42 AM 07/22/2023   10:37 AM 06/25/2023    6:33 PM  Vitals with BMI  Height 5' 1 5' 1   Weight 134 lbs 10 oz 131 lbs 3 oz   BMI 25.45 24.8   Systolic 120 118 842  Diastolic 62 64 80  Pulse 75 70 76  Vital signs reviewed.  Nursing notes reviewed. Weight trend reviewed. Physical Exam General Appearance:  No acute distress appreciable.   Well-groomed, healthy-appearing female.  Well proportioned with no abnormal fat distribution.  Good muscle tone. Pulmonary:  Normal work of breathing at rest, no respiratory distress apparent. SpO2: 98 %  Musculoskeletal: All extremities are intact.  Neurological:  Awake, alert, oriented, and engaged.  No obvious focal neurological deficits or cognitive impairments.  Sensorium seems unclouded.   Speech is clear and coherent with logical content. Psychiatric:  Appropriate mood, pleasant and cooperative demeanor, thoughtful and engaged during the exam Physical Exam EXTREMITIES: Tenderness between heel and ankle, right foot. Tenderness along plantar fascia, right foot. No cyanosis or edema.  '    Results:    07/22/2023   10:53 AM 08/09/2019   11:47 AM 06/04/2019   11:26 AM 06/04/2019   11:02 AM  PHQ 2/9 Scores  PHQ - 2 Score 0 3 0 0  PHQ- 9 Score  16 1    Results LABS Vitamin B12: normal     Results for orders placed or performed in visit on 08/13/23  CBC with Differential/Platelet  Result Value Ref Range   WBC 7.2 4.0 - 10.5 K/uL   RBC 4.45 3.87 - 5.11 Mil/uL   Hemoglobin 12.0 12.0 - 15.0 g/dL   HCT 62.9 63.9 - 53.9 %   MCV 83.2 78.0 - 100.0 fl   MCHC 32.5  30.0 - 36.0 g/dL   RDW 85.5 88.4 - 84.4 %   Platelets 231.0 150.0 - 400.0 K/uL   Neutrophils Relative % 59.7 43.0 - 77.0 %   Lymphocytes Relative 34.2 12.0 - 46.0 %   Monocytes Relative 5.2 3.0 - 12.0 %   Eosinophils Relative 0.2 0.0 - 5.0 %   Basophils Relative 0.7 0.0 - 3.0 %   Neutro Abs 4.3 1.4 - 7.7 K/uL   Lymphs Abs 2.5 0.7 - 4.0 K/uL   Monocytes Absolute 0.4 0.1 - 1.0 K/uL   Eosinophils Absolute 0.0 0.0 - 0.7 K/uL   Basophils Absolute 0.1 0.0 - 0.1 K/uL  Comp Met (CMET)  Result Value Ref Range   Sodium 139 135 - 145 mEq/L   Potassium 3.9 3.5 - 5.1 mEq/L   Chloride 104 96 - 112 mEq/L   CO2 28 19 - 32 mEq/L   Glucose, Bld 135 (H) 70 - 99 mg/dL   BUN 10 6 - 23 mg/dL   Creatinine, Ser 9.08 0.40 - 1.20 mg/dL  Total Bilirubin 0.4 0.2 - 1.2 mg/dL   Alkaline Phosphatase 57 39 - 117 U/L   AST 22 0 - 37 U/L   ALT 12 0 - 35 U/L   Total Protein 7.7 6.0 - 8.3 g/dL   Albumin 4.3 3.5 - 5.2 g/dL   GFR 34.62 >39.99 mL/min   Calcium  9.2 8.4 - 10.5 mg/dL  A87 and Folate Panel  Result Value Ref Range   Vitamin B-12 910 211 - 911 pg/mL   Folate 12.9 >5.9 ng/mL  Protein Electrophoresis, (serum)  Result Value Ref Range   Total Protein 7.3 6.1 - 8.1 g/dL  RPR  Result Value Ref Range   RPR Ser Ql NON-REACTIVE NON-REACTIVE  Lyme Disease Serology w/Reflex  Result Value Ref Range   Lyme Total Antibody EIA Negative Negative  HIV antibody (with reflex)  Result Value Ref Range   HIV FINAL INTERPRETATION     HIV 1&2 Ab, 4th Generation NON-REACTIVE NON-REACTIVE  HgB A1c  Result Value Ref Range   Hgb A1c MFr Bld 5.9 4.6 - 6.5 %  ANA w/Reflex if Positive  Result Value Ref Range   Anti Nuclear Antibody (ANA) Positive (A) Negative   dsDNA Ab <1 0 - 9 IU/mL   ENA RNP Ab 0.2 0.0 - 0.9 AI   ENA SM Ab Ser-aCnc <0.2 0.0 - 0.9 AI   Scleroderma (Scl-70) (ENA) Antibody, IgG <0.2 0.0 - 0.9 AI   ENA SSA (RO) Ab <0.2 0.0 - 0.9 AI   ENA SSB (LA) Ab <0.2 0.0 - 0.9 AI   Chromatin Ab SerPl-aCnc <0.2  0.0 - 0.9 AI   Anti JO-1 <0.2 0.0 - 0.9 AI   Centromere Ab Screen <0.2 0.0 - 0.9 AI   See below: Comment   Magnesium   Result Value Ref Range   Magnesium  2.1 1.5 - 2.5 mg/dL  C-reactive protein  Result Value Ref Range   CRP <1.0 0.5 - 20.0 mg/dL  Sedimentation rate  Result Value Ref Range   Sed Rate 31 (H) 0 - 30 mm/hr   Office Visit on 08/13/2023  Component Date Value Ref Range Status   WBC 08/13/2023 7.2  4.0 - 10.5 K/uL Final   RBC 08/13/2023 4.45  3.87 - 5.11 Mil/uL Final   Hemoglobin 08/13/2023 12.0  12.0 - 15.0 g/dL Final   HCT 92/97/7974 37.0  36.0 - 46.0 % Final   MCV 08/13/2023 83.2  78.0 - 100.0 fl Final   MCHC 08/13/2023 32.5  30.0 - 36.0 g/dL Final   RDW 92/97/7974 14.4  11.5 - 15.5 % Final   Platelets 08/13/2023 231.0  150.0 - 400.0 K/uL Final   Neutrophils Relative % 08/13/2023 59.7  43.0 - 77.0 % Final   Lymphocytes Relative 08/13/2023 34.2  12.0 - 46.0 % Final   Monocytes Relative 08/13/2023 5.2  3.0 - 12.0 % Final   Eosinophils Relative 08/13/2023 0.2  0.0 - 5.0 % Final   Basophils Relative 08/13/2023 0.7  0.0 - 3.0 % Final   Neutro Abs 08/13/2023 4.3  1.4 - 7.7 K/uL Final   Lymphs Abs 08/13/2023 2.5  0.7 - 4.0 K/uL Final   Monocytes Absolute 08/13/2023 0.4  0.1 - 1.0 K/uL Final   Eosinophils Absolute 08/13/2023 0.0  0.0 - 0.7 K/uL Final   Basophils Absolute 08/13/2023 0.1  0.0 - 0.1 K/uL Final   Sodium 08/13/2023 139  135 - 145 mEq/L Final   Potassium 08/13/2023 3.9  3.5 - 5.1 mEq/L Final   Chloride 08/13/2023 104  96 - 112 mEq/L Final   CO2 08/13/2023 28  19 - 32 mEq/L Final   Glucose, Bld 08/13/2023 135 (H)  70 - 99 mg/dL Final   BUN 92/97/7974 10  6 - 23 mg/dL Final   Creatinine, Ser 08/13/2023 0.91  0.40 - 1.20 mg/dL Final   Total Bilirubin 08/13/2023 0.4  0.2 - 1.2 mg/dL Final   Alkaline Phosphatase 08/13/2023 57  39 - 117 U/L Final   AST 08/13/2023 22  0 - 37 U/L Final   ALT 08/13/2023 12  0 - 35 U/L Final   Total Protein 08/13/2023 7.7  6.0 - 8.3  g/dL Final   Albumin 92/97/7974 4.3  3.5 - 5.2 g/dL Final   GFR 92/97/7974 65.37  >60.00 mL/min Final   Calcium  08/13/2023 9.2  8.4 - 10.5 mg/dL Final   Vitamin A-87 92/97/7974 910  211 - 911 pg/mL Final   Folate 08/13/2023 12.9  >5.9 ng/mL Final   Total Protein 08/13/2023 7.3  6.1 - 8.1 g/dL Final   RPR Ser Ql 92/97/7974 NON-REACTIVE  NON-REACTIVE Final   Lyme Total Antibody EIA 08/13/2023 Negative  Negative Final   HIV FINAL INTERPRETATION 08/13/2023    Final   HIV 1&2 Ab, 4th Generation 08/13/2023 NON-REACTIVE  NON-REACTIVE Final   Hgb A1c MFr Bld 08/13/2023 5.9  4.6 - 6.5 % Final   Anti Nuclear Antibody (ANA) 08/13/2023 Positive (A)  Negative Final   dsDNA Ab 08/13/2023 <1  0 - 9 IU/mL Final   ENA RNP Ab 08/13/2023 0.2  0.0 - 0.9 AI Final   ENA SM Ab Ser-aCnc 08/13/2023 <0.2  0.0 - 0.9 AI Final   Scleroderma (Scl-70) (ENA) Antibod* 08/13/2023 <0.2  0.0 - 0.9 AI Final   ENA SSA (RO) Ab 08/13/2023 <0.2  0.0 - 0.9 AI Final   ENA SSB (LA) Ab 08/13/2023 <0.2  0.0 - 0.9 AI Final   Chromatin Ab SerPl-aCnc 08/13/2023 <0.2  0.0 - 0.9 AI Final   Anti JO-1 08/13/2023 <0.2  0.0 - 0.9 AI Final   Centromere Ab Screen 08/13/2023 <0.2  0.0 - 0.9 AI Final   See below: 08/13/2023 Comment   Final   Magnesium  08/13/2023 2.1  1.5 - 2.5 mg/dL Final   CRP 92/97/7974 <1.0  0.5 - 20.0 mg/dL Final   Sed Rate 92/97/7974 31 (H)  0 - 30 mm/hr Final  Office Visit on 07/22/2023  Component Date Value Ref Range Status   Methylmalonic Acid, Quant 07/22/2023 131  69 - 390 nmol/L Final   Vitamin B-12 07/22/2023 309  211 - 911 pg/mL Final   VITD 07/22/2023 35.22  30.00 - 100.00 ng/mL Final  Admission on 06/25/2023, Discharged on 06/25/2023  Component Date Value Ref Range Status   Influenza A Antigen, POC 06/25/2023 Negative  Negative Final   Influenza B Antigen, POC 06/25/2023 Negative  Negative Final   Covid Antigen, POC 06/25/2023 Negative  Negative Final  No image results found. DG Cervical Spine  Complete Result Date: 08/13/2023 CLINICAL DATA:  Back pain and neuropathy. EXAM: CERVICAL SPINE - COMPLETE 4+ VIEW COMPARISON:  Cervical spine radiographs 04/18/2005 FINDINGS: There is again straightening of the normal cervical lordosis. No sagittal spondylolisthesis. The atlantodens interval is intact. Vertebral body heights are maintained. Minimal anterior C5-6 and C6-7 disc space narrowing. Mild anterior C4-5, C5-6, and C6-7 endplate osteophytes are mildly worsened from remote 2007 comparison. Very mild right C5-6 neuroforaminal narrowing secondary to uncovertebral and facet joint hypertrophy on oblique view, unchanged to mildly worsened  from prior. The lateral masses of C1 are symmetrically aligned with the dens on open mouth odontoid view. No prevertebral soft tissue swelling. IMPRESSION: 1. Mild C4-5, C5-6, and C6-7 degenerative disc and endplate changes, mildly worsened from remote 2007 comparison. 2. Very mild right C5-6 neuroforaminal narrowing secondary to uncovertebral and facet joint hypertrophy, unchanged to mildly worsened from prior. Electronically Signed   By: Tanda Lyons M.D.   On: 08/13/2023 16:34   DG Thoracic Spine W/Swimmers Result Date: 08/13/2023 CLINICAL DATA:  Back pain. EXAM: THORACIC SPINE - 3 VIEWS COMPARISON:  Chest two views 05/19/2019 FINDINGS: There are 12 rib-bearing thoracic type vertebral bodies. Normal frontal alignment. No sagittal spondylolisthesis. Vertebral body heights are maintained. Mild multilevel anterior disc space narrowing and anterior endplate spurring throughout the thoracic spine, similar to prior. Mild descending thoracic aorta atherosclerotic calcifications. Moderate atherosclerotic calcifications within the aortic arch. IMPRESSION: Mild multilevel degenerative disc and endplate changes throughout the thoracic spine, similar to prior. Electronically Signed   By: Tanda Lyons M.D.   On: 08/13/2023 16:31   DG Lumbar Spine Complete Result Date:  08/13/2023 CLINICAL DATA:  Back pain and neuropathy. EXAM: LUMBAR SPINE - COMPLETE 4+ VIEW COMPARISON:  Lumbar spine radiographs 01/14/2022 FINDINGS: There are 5 non-rib-bearing lumbar-type vertebral bodies. Vertebral body heights are maintained. No sagittal spondylolisthesis. Moderate left L2-3 and right L3-4 endplate osteophytes on frontal view. Mild anterior L3-4 greater than L2-3 endplate osteophytes on lateral view. Disc spaces are preserved. Mild L5-S1 facet joint sclerosis and hypertrophy. IMPRESSION: 1. Mild L5-S1 facet joint osteoarthritis. 2. Mild L2-3 and L3-4 degenerative endplate osteophytes. Electronically Signed   By: Tanda Lyons M.D.   On: 08/13/2023 16:29   DG HIPS BILAT W OR W/O PELVIS MIN 5 VIEWS Result Date: 08/13/2023 CLINICAL DATA:  Bilateral hip pain. EXAM: DG HIP (WITH OR WITHOUT PELVIS) 5+V BILAT COMPARISON:  CT abdomen and pelvis 08/02/2019 FINDINGS: Normal bone mineralization. Mild bilateral inferior sacroiliac subchondral sclerosis without joint space narrowing. Mild superolateral left greater right acetabular degenerative osteophytes. The bilateral femoroacetabular and pubic symphysis joint spaces are maintained. Normal morphology of the bilateral femoral head-neck junctions without CAM-type bump deformity. No acute fracture is seen. No dislocation. IMPRESSION: No significant osteoarthritis. Mild superolateral left greater than right acetabular degenerative osteophytes but no joint space narrowing. Electronically Signed   By: Tanda Lyons M.D.   On: 08/13/2023 16:26   DG Foot Complete Left Result Date: 08/13/2023 CLINICAL DATA:  Foot pain.  No known injury. EXAM: LEFT FOOT - COMPLETE 3+ VIEW COMPARISON:  None Available. FINDINGS: Minimal peripheral degenerative spurring at the great toe metatarsophalangeal joint without joint space narrowing. Tiny posterior greater than plantar calcaneal heel spurs. Minimal degenerative spurring at the navicular-cuneiform articulation on lateral  view. No acute fracture or dislocation. IMPRESSION: 1. Minimal great toe metatarsophalangeal joint peripheral degenerative spurring without joint space narrowing. 2. Tiny posterior greater than plantar calcaneal heel spurs. Electronically Signed   By: Tanda Lyons M.D.   On: 08/13/2023 16:24   DG Foot Complete Right Result Date: 08/13/2023 CLINICAL DATA:  Foot pain.  No known injury. EXAM: RIGHT FOOT COMPLETE - 3+ VIEW COMPARISON:  None Available. FINDINGS: Minimal great toe metatarsophalangeal joint space narrowing with peripheral degenerative spurring. Mild chronic enthesopathic change at the Achilles tendon insertion on the calcaneus. Tiny plantar calcaneal heel spur at the plantar fascia origin. No acute fracture or dislocation. IMPRESSION: 1. Minimal great toe metatarsophalangeal joint osteoarthritis. 2. Mild chronic enthesopathic change at the Achilles tendon insertion on  the calcaneus. Electronically Signed   By: Tanda Lyons M.D.   On: 08/13/2023 16:22         ASSESSMENT & PLAN   Assessment & Plan Foot pain, bilateral Chronic pain in both feet, more severe in the right, aligns with plantar fasciitis and has persisted for 5-6 years. Pain worsens with standing and working, accompanied by a sensation of heat. Order foot x-rays and prescribe lidocaine  ointment for topical pain relief. Continue gabapentin  for pain management. Refer to a foot specialist if necessary after x-ray results. Idiopathic neuropathy Pain and numbness in the feet and hands, with radiating pain to the back, stomach, and head, suggest peripheral neuropathy. Possible causes include vitamin B12 deficiency or myeloma, though previous B12 levels were normal. Order comprehensive blood work to investigate causes. Continue vitamin B12 supplementation and increase gabapentin  dosage for pain management. Refer to neurology for further evaluation and order x-rays of the back, hips, and neck to assess for nerve damage. Blurry  vision Burning sensation and blurry vision require evaluation by an ophthalmologist. Assistance may be needed to facilitate communication during the referral process. Refer to an ophthalmologist for evaluation. Language barrier to Conservation officer, historic buildings used and family present; translations are very difficult and understanding limited. Foot and ankle pain Chronic pain in both feet, more severe in the right, aligns with plantar fasciitis and has persisted for 5-6 years. Pain worsens with standing and working, accompanied by a sensation of heat. Order foot x-rays and prescribe lidocaine  ointment for topical pain relief. Continue gabapentin  for pain management. Refer to a foot specialist if necessary after x-ray results. Other osteoporosis, unspecified pathological fracture presence Bone loss is being managed with alendronate . Continue alendronate  for bone health.   ORDER ASSOCIATIONS  #   DIAGNOSIS / CONDITION ICD-10 ENCOUNTER ORDER     ICD-10-CM   1. Foot pain, bilateral  M79.671 DG Foot Complete Right   M79.672 DG Foot Complete Left    2. Idiopathic neuropathy  G60.9 CBC with Differential/Platelet    Comp Met (CMET)    B12 and Folate Panel    Protein Electrophoresis, (serum)    RPR    Lyme Disease Serology w/Reflex    Methylmalonic acid, serum    HIV antibody (with reflex)    HgB A1c    lidocaine -prilocaine  (EMLA ) cream    cyanocobalamin  (VITAMIN B12) injection 1,000 mcg    ANA w/Reflex if Positive    Magnesium     C-reactive protein    Vitamin B1    Sedimentation rate    pregabalin  (LYRICA ) 200 MG capsule    DG HIPS BILAT W OR W/O PELVIS MIN 5 VIEWS    DG Lumbar Spine Complete    DG Thoracic Spine W/Swimmers    DG Cervical Spine Complete    Ambulatory referral to Neurology    3. Language barrier to communication  Z78.9     4. Myalgia  M79.10     5. Screening examination for infectious disease  Z11.9     6. Blurry vision  H53.8 Ambulatory referral to Ophthalmology     7. Foot and ankle pain  M79.673    M25.579     8. Other osteoporosis, unspecified pathological fracture presence  M81.8      Meds ordered this encounter  Medications   lidocaine -prilocaine  (EMLA ) cream    Sig: Apply topically 2 (two) times daily.    Dispense:  30 g    Refill:  0   cyanocobalamin  (VITAMIN B12) injection 1,000 mcg  pregabalin  (LYRICA ) 200 MG capsule    Sig: Take 1 capsule (200 mg total) by mouth 2 (two) times daily. Replaces gabapentin     Dispense:  60 capsule    Refill:  1   lidocaine -prilocaine  (EMLA ) cream    Sig: Apply 1 Application topically as needed.    Dispense:  30 g    Refill:  0    Orders Placed This Encounter  Procedures   DG Foot Complete Right    Standing Status:   Future    Number of Occurrences:   1    Expected Date:   08/13/2023    Expiration Date:   08/12/2024    Reason for Exam (SYMPTOM  OR DIAGNOSIS REQUIRED):   foot pain    Preferred imaging location?:   [facility]   DG Foot Complete Left    Standing Status:   Future    Number of Occurrences:   1    Expected Date:   08/13/2023    Expiration Date:   08/12/2024    Reason for Exam (SYMPTOM  OR DIAGNOSIS REQUIRED):   foot pain    Preferred imaging location?:   [facility] [place]   DG HIPS BILAT W OR W/O PELVIS MIN 5 VIEWS    Standing Status:   Future    Number of Occurrences:   1    Expected Date:   08/13/2023    Expiration Date:   11/13/2023    Reason for Exam (SYMPTOM  OR DIAGNOSIS REQUIRED):   bilateral hip pain    Preferred imaging location?:   [facility]   DG Lumbar Spine Complete    Standing Status:   Future    Number of Occurrences:   1    Expected Date:   08/13/2023    Expiration Date:   08/12/2024    Reason for Exam (SYMPTOM  OR DIAGNOSIS REQUIRED):   back pain and neuropathy    Preferred imaging location?:   [facility] Thoracic Spine W/Swimmers    Standing Status:   Future    Number of Occurrences:   1    Expected Date:   08/13/2023     Expiration Date:   02/13/2024    Reason for Exam (SYMPTOM  OR DIAGNOSIS REQUIRED):   back pain and neuropathy    Preferred imaging location?:   [place] [facility]   DG Cervical Spine Complete    Standing Status:   Future    Number of Occurrences:   1    Expected Date:   08/13/2023    Expiration Date:   08/12/2024    Reason for Exam (SYMPTOM  OR DIAGNOSIS REQUIRED):   back pain and neuropathy    Preferred imaging location?:    [facility]   CBC with Differential/Platelet   Comp Met (CMET)   B12 and Folate Panel   Protein Electrophoresis, (serum)   RPR   Lyme Disease Serology w/Reflex   Methylmalonic acid, serum   HIV antibody (with reflex)   HgB A1c   ANA w/Reflex if Positive   Magnesium    C-reactive protein   Vitamin B1   Sedimentation rate   Ambulatory referral to Ophthalmology    Referral Priority:   Routine    Referral Type:   Consultation    Referral Reason:   Specialty Services Required    Requested Specialty:   Ophthalmology    Number of Visits Requested:   1  Ambulatory referral to Neurology    Referral Priority:   Routine    Referral Type:   Consultation    Referral Reason:   Specialty Services Required    Requested Specialty:   Neurology        This document was synthesized by artificial intelligence ([employer]bridge) using HIPAA-compliant recording of the clinical interaction;   We discussed the use of AI scribe software for clinical note transcription with the patient, who gave verbal consent to proceed. additional Info: This encounter employed state-of-the-art, real-time, collaborative documentation. The patient actively reviewed and assisted in updating their electronic medical record on a shared screen, ensuring transparency and facilitating joint problem-solving for the problem list, overview, and plan. This approach promotes accurate, informed care. The treatment plan was discussed and reviewed in detail, including medication safety, potential side effects,  and all patient questions. We confirmed understanding and comfort with the plan. Follow-up instructions were established, including contacting the office for any concerns, returning if symptoms worsen, persist, or new symptoms develop, and precautions for potential emergency department visits.

## 2023-08-14 ENCOUNTER — Ambulatory Visit: Payer: Self-pay | Admitting: Internal Medicine

## 2023-08-14 DIAGNOSIS — I700 Atherosclerosis of aorta: Secondary | ICD-10-CM

## 2023-08-14 LAB — ANA W/REFLEX IF POSITIVE
Anti JO-1: <0.2 AI (ref 0.0–0.9)
Anti Nuclear Antibody (ANA): POSITIVE — AB
Centromere Ab Screen: <0.2 AI (ref 0.0–0.9)
Chromatin Ab SerPl-aCnc: <0.2 AI (ref 0.0–0.9)
ENA RNP Ab: 0.2 AI (ref 0.0–0.9)
ENA SM Ab Ser-aCnc: <0.2 AI (ref 0.0–0.9)
ENA SSA (RO) Ab: <0.2 AI (ref 0.0–0.9)
ENA SSB (LA) Ab: <0.2 AI (ref 0.0–0.9)
Scleroderma (Scl-70) (ENA) Antibody, IgG: <0.2 AI (ref 0.0–0.9)
dsDNA Ab: <1 [IU]/mL (ref 0–9)

## 2023-08-14 LAB — LYME DISEASE SEROLOGY W/REFLEX: Lyme Total Antibody EIA: NEGATIVE

## 2023-08-15 NOTE — Assessment & Plan Note (Signed)
 Bone loss is being managed with alendronate . Continue alendronate  for bone health.

## 2023-08-15 NOTE — Patient Instructions (Addendum)
 It was a pleasure seeing you today! Your health and satisfaction are our top priorities.  Bernardino Cone, MD  Your Providers PCP: Cone Bernardino MATSU, MD,  [phone]) Referring Provider: Cone Bernardino MATSU, MD,  [phone])     NEXT STEPS: [x]  Early Intervention: Schedule sooner appointment, call our on-call services, or go to emergency room if there is any significant Increase in pain or discomfort New or worsening symptoms Sudden or severe changes in your health [x]  Flexible Follow-Up: We recommend a No follow-ups on file. for optimal routine care. This allows for progress monitoring and treatment adjustments. [x]  Preventive Care: Schedule your annual preventive care visit! It's typically covered by insurance and helps identify potential health issues early. [x]  Lab & X-ray Appointments: Incomplete tests scheduled today, or call to schedule. X-rays: [facility] (M-F, 8:30am-noon or 1pm-5pm). [x]  Medical Information Release: Sign a release form at front desk to obtain relevant medical information we don't have.  MAKING THE MOST OF OUR FOCUSED 20 MINUTE APPOINTMENTS: [x]   Clearly state your top concerns at the beginning of the visit to focus our discussion [x]   If you anticipate you will need more time, please inform the front desk during scheduling - we can book multiple appointments in the same week. [x]   If you have transportation problems- use our convenient video appointments or ask about transportation support. [x]   We can get down to business faster if you use MyChart to update information before the visit and submit non-urgent questions before your visit. Thank you for taking the time to provide details through MyChart.  Let our nurse know and she can import this information into your encounter documents.  Arrival and Wait Times: [x]   Arriving on time ensures that everyone receives prompt attention. [x]   Early morning (8a) and afternoon (1p) appointments tend to  have shortest wait times. [x]   Unfortunately, we cannot delay appointments for late arrivals or hold slots during phone calls.  Getting Answers and Following Up [x]   Simple Questions & Concerns: For quick questions or basic follow-up after your visit, reach us  at [phone] or MyChart messaging. [x]   Complex Concerns: If your concern is more complex, scheduling an appointment might be best. Discuss this with the staff to find the most suitable option. [x]   Lab & Imaging Results: We'll contact you directly if results are abnormal or you don't use MyChart. Most normal results will be on MyChart within 2-3 business days, with a review message from Dr. Cone. Haven't heard back in 2 weeks? Need results sooner? Contact us  at [phone]. [x]   Referrals: Our referral coordinator will manage specialist referrals. The specialist's office should contact you within 2 weeks to schedule an appointment. Call us  if you haven't heard from them after 2 weeks.  Staying Connected [x]   MyChart: Activate your MyChart for the fastest way to access results and message us . See the last page of this paperwork for instructions on how to activate.  Bring to Your Next Appointment [x]   Medications: Please bring all your medication bottles to your next appointment to ensure we have an accurate record of your prescriptions. [x]   Health Diaries: If you're monitoring any health conditions at home, keeping a diary of your readings can be very helpful for discussions at your next appointment.  Billing [x]   X-ray & Lab Orders: These are billed by separate companies. Contact the invoicing company directly for questions or concerns. [x]   Visit Charges: Discuss any billing inquiries with our administrative services  team.  Your Satisfaction Matters [x]   Share Your Experience: We strive for your satisfaction! If you have any complaints, or preferably compliments, please let Dr. Jesus know directly or contact our Practice  Administrators, Manuelita Rubin or [employer], by asking at the front desk.   Reviewing Your Records [x]   Review this early draft of your clinical encounter notes below and the final encounter summary tomorrow on MyChart after its been completed.  All orders placed so far are visible here: Foot pain, bilateral -     DG Foot Complete Right; Future -     DG Foot Complete Left; Future  Idiopathic neuropathy -     CBC with Differential/Platelet -     Comprehensive metabolic panel with GFR -     A87 and Folate Panel -     Protein electrophoresis, serum -     RPR -     Lyme Disease Serology w/Reflex -     Methylmalonic acid, serum -     HIV Antibody (routine testing w rflx) -     Hemoglobin A1c -     Lidocaine -Prilocaine ; Apply topically 2 (two) times daily.  Dispense: 30 g; Refill: 0 -     Cyanocobalamin  -     ANA w/Reflex if Positive -     Magnesium  -     C-reactive protein -     Vitamin B1 -     Sedimentation rate -     Pregabalin ; Take 1 capsule (200 mg total) by mouth 2 (two) times daily. Replaces gabapentin   Dispense: 60 capsule; Refill: 1 -     DG HIPS BILAT W OR W/O PELVIS MIN 5 VIEWS; Future -     DG Lumbar Spine Complete; Future -     DG Thoracic Spine W/Swimmers; Future -     DG Cervical Spine Complete; Future -     Ambulatory referral to Neurology  Language barrier to communication  Myalgia  Screening examination for infectious disease  Blurry vision -     Ambulatory referral to Ophthalmology  Foot and ankle pain  Other osteoporosis, unspecified pathological fracture presence  Other orders -     Lidocaine -Prilocaine ; Apply 1 Application topically as needed.  Dispense: 30 g; Refill: 0

## 2023-08-15 NOTE — Assessment & Plan Note (Signed)
 Interpreter used and family present; translations are very difficult and understanding limited.

## 2023-08-15 NOTE — Assessment & Plan Note (Signed)
 Pain and numbness in the feet and hands, with radiating pain to the back, stomach, and head, suggest peripheral neuropathy. Possible causes include vitamin B12 deficiency or myeloma, though previous B12 levels were normal. Order comprehensive blood work to investigate causes. Continue vitamin B12 supplementation and increase gabapentin  dosage for pain management. Refer to neurology for further evaluation and order x-rays of the back, hips, and neck to assess for nerve damage.

## 2023-08-16 ENCOUNTER — Encounter: Payer: Self-pay | Admitting: Internal Medicine

## 2023-08-16 DIAGNOSIS — R768 Other specified abnormal immunological findings in serum: Secondary | ICD-10-CM | POA: Insufficient documentation

## 2023-08-16 DIAGNOSIS — I700 Atherosclerosis of aorta: Secondary | ICD-10-CM | POA: Insufficient documentation

## 2023-08-16 DIAGNOSIS — M539 Dorsopathy, unspecified: Secondary | ICD-10-CM | POA: Insufficient documentation

## 2023-08-16 DIAGNOSIS — R7303 Prediabetes: Secondary | ICD-10-CM | POA: Insufficient documentation

## 2023-08-16 DIAGNOSIS — R700 Elevated erythrocyte sedimentation rate: Secondary | ICD-10-CM | POA: Insufficient documentation

## 2023-08-16 DIAGNOSIS — M1990 Unspecified osteoarthritis, unspecified site: Secondary | ICD-10-CM | POA: Insufficient documentation

## 2023-08-18 ENCOUNTER — Other Ambulatory Visit: Payer: Self-pay

## 2023-08-18 DIAGNOSIS — R768 Other specified abnormal immunological findings in serum: Secondary | ICD-10-CM

## 2023-08-18 LAB — PROTEIN ELECTROPHORESIS, SERUM
Albumin ELP: 4.1 g/dL (ref 3.8–4.8)
Alpha 1: 0.3 g/dL (ref 0.2–0.3)
Alpha 2: 0.7 g/dL (ref 0.5–0.9)
Beta 2: 0.4 g/dL (ref 0.2–0.5)
Beta Globulin: 0.5 g/dL (ref 0.4–0.6)
Gamma Globulin: 1.3 g/dL (ref 0.8–1.7)
Total Protein: 7.3 g/dL (ref 6.1–8.1)

## 2023-08-18 LAB — HIV ANTIBODY (ROUTINE TESTING W REFLEX): HIV 1&2 Ab, 4th Generation: NONREACTIVE

## 2023-08-18 LAB — VITAMIN B1: Vitamin B1 (Thiamine): 8 nmol/L (ref 8–30)

## 2023-08-18 LAB — RPR: RPR Ser Ql: NONREACTIVE

## 2023-08-18 LAB — METHYLMALONIC ACID, SERUM: Methylmalonic Acid, Quant: 115 nmol/L (ref 69–390)

## 2023-08-18 NOTE — Telephone Encounter (Signed)
 Placed referral for RA Dr.

## 2023-08-22 ENCOUNTER — Encounter: Payer: Self-pay | Admitting: Neurology

## 2023-08-22 NOTE — Progress Notes (Unsigned)
 Initial neurology clinic note  Reason for Evaluation: Consultation requested by Jesus Bernardino MATSU, MD for an opinion regarding diffuse pain. My final recommendations will be communicated back to the requesting physician by way of shared medical record or letter to requesting physician via [place]  mail.  HPI: This is Ms. Dawn [name], a 68 y.o. right-handed female with a medical history of HTN, DM, HLD, vit D deficiency, GERD, OA, osteoporosis, current smoker who presents to neurology clinic with the chief complaint of pain in legs, arms, back, neck, and face. The patient is accompanied by friend of daughter and [place] interpreter.  Patient's symptoms have been present for many years. She has pain from her heel into both legs into back. She also has pain in her neck, neck, and face. It is a burning sensation. Her vision can be blurry when standing for longer periods of time. When she sits down, it feels like someone is pushing her forward. She denies muscle weakness. She endorses joint pain. She mentions that when she wakes in the morning she will have stiffness of hands that improves after about an hour.  She has done PT that helped some. She has tried tylenol  that does not help. She has another medication, gabapentin  600 mg TID and baclofen , that helps a little bit. Of note, she is also on atorvastatin .  She was also referred to rheumatology but has not seen them yet.  The patient has not noticed any recent skin rashes nor does she report any constitutional symptoms like fever, night sweats, but endorses about 10 lbs of weight loss over unclear period of time.  EtOH use: no Current smoker  Restrictive diet? No Family history of neurologic disease? No    MEDICATIONS:  Outpatient Encounter Medications as of 08/28/2023  Medication Sig   alendronate  (FOSAMAX ) 70 MG tablet Take 1 tablet (70 mg total) by mouth once a week.   amLODipine  (NORVASC ) 2.5 MG tablet Take 1 tablet (2.5 mg total) by mouth  daily.   atorvastatin  (LIPITOR) 40 MG tablet Take 1 tablet (40 mg total) by mouth daily.   baclofen  (LIORESAL ) 10 MG tablet Take 1 tablet (10 mg total) by mouth every 12 (twelve) hours as needed for muscle spasms.   Cyanocobalamin  (B-12) 1000 MCG SUBL Place 1 tablet under the tongue daily at 6 (six) AM.   ergocalciferol  (VITAMIN D2) 1.25 MG (50000 UT) capsule Take 1 capsule (50,000 Units total) by mouth once a week.   lidocaine -prilocaine  (EMLA ) cream Apply topically 2 (two) times daily.   lidocaine -prilocaine  (EMLA ) cream Apply 1 Application topically as needed.   losartan  (COZAAR ) 100 MG tablet Take 1 tablet (100 mg total) by mouth daily.   metFORMIN  (GLUCOPHAGE ) 500 MG tablet Take 1 tablet (500 mg total) by mouth daily.   pantoprazole  (PROTONIX ) 40 MG tablet Take 1 tablet (40 mg total) by mouth daily.   pregabalin  (LYRICA ) 200 MG capsule Take 1 capsule (200 mg total) by mouth 2 (two) times daily. Replaces gabapentin    Facility-Administered Encounter Medications as of 08/28/2023  Medication   cyanocobalamin  (VITAMIN B12) injection 1,000 mcg    PAST MEDICAL HISTORY: Past Medical History:  Diagnosis Date   Acute hyponatremia 03/06/2020   Arthritis    Diabetes mellitus without complication (HCC)    Elevated BP without diagnosis of hypertension    Hypertension     PAST SURGICAL HISTORY: Past Surgical History:  Procedure Laterality Date   CESAREAN SECTION      ALLERGIES: Allergies  Allergen Reactions  Thiazide-Type Diuretics Other (See Comments)    Hyponatremia serum sodium 107    FAMILY HISTORY: Family History  Problem Relation Age of Onset   Chorea Father    Colon cancer Neg Hx    Esophageal cancer Neg Hx    Rectal cancer Neg Hx    Stomach cancer Neg Hx     SOCIAL HISTORY: Social History   Tobacco Use   Smoking status: Former    Types: Cigarettes   Smokeless tobacco: Never   Tobacco comments:    1 pack a day 08/28/23  Vaping Use   Vaping status: Never Used   Substance Use Topics   Alcohol use: Never   Drug use: Never   Social History   Social History Narrative   Are you right handed or left handed? Right   Are you currently employed ? no   What is your current occupation?   Do you live at home alone?   Who lives with you? family   What type of home do you live in: 1 story or 2 story? two    No caffiene     OBJECTIVE: PHYSICAL EXAM: BP (!) 120/59   Pulse 78   Ht 5' 1 (1.549 m)   Wt 137 lb (62.1 kg)   SpO2 98%   BMI 25.89 kg/m   General: General appearance: Awake and alert. No distress. Cooperative with exam.  Skin: No obvious rash or jaundice. HEENT: Atraumatic. Anicteric. Lungs: Non-labored breathing on room air  Heart: Regular Extremities: No edema. No obvious deformity. Tenderness to palpation in neck, shoulders, bilateral arms, and bilateral legs. Psych: Affect appropriate.  Neurological: Mental Status: Alert. Speech fluent. No pseudobulbar affect Cranial Nerves: CNII: No RAPD. Visual fields grossly intact. CNIII, IV, VI: PERRL. No nystagmus. EOMI. CN V: Facial sensation intact bilaterally to fine touch. CN VII: Facial muscles symmetric and strong. No ptosis at rest. CN VIII: Hearing grossly intact bilaterally. CN IX: No hypophonia. CN X: Palate elevates symmetrically. CN XI: Full strength shoulder shrug bilaterally. CN XII: Tongue protrusion full and midline. No atrophy or fasciculations. No significant dysarthria Motor: Tone is normal. No atrophy.  Individual muscle group testing (MRC grade out of 5):  Movement     Neck flexion 5    Neck extension 5     Right Left   Shoulder abduction 5 5   Elbow flexion 5 5   Elbow extension 5 5   Finger abduction - FDI 5 5   Finger abduction - ADM 5 5   Finger extension 5 5   Finger distal flexion - 2/3 5 5    Finger distal flexion - 4/5 5 5    Thumb flexion - FPL 5 5   Thumb abduction - APB 5 5    Hip flexion 5 5   Hip extension 5 5   Hip adduction 5 5    Hip abduction 5 5   Knee extension 5 5   Knee flexion 5 5   Dorsiflexion 5 5   Plantarflexion 5 5     Reflexes:  Right Left   Bicep 2+ 2+   Tricep 2+ 2+   BrRad 2+ 2+   Knee 2+ 2+   Ankle 2+ 2+    Pathological Reflexes: Babinski: flexor response bilaterally Hoffman: absent bilaterally Troemner: absent bilaterally Sensation: Pinprick: Intact in all extremities Vibration: Intact in all extremities Proprioception: Intact in bilateral great toes Coordination: Intact finger-to- nose-finger bilaterally. Romberg negative. Gait: Able to rise from chair with arms  crossed unassisted. Normal, narrow-based gait. Able to walk on toes and heels.  Lab and Test Review: Internal labs: 08/13/23: ESR: 31 (normal for age) B1 wnl CRP < 1.0 Mg 2.1 ANA: positive dsDNA, RNP, Sm, Scl-70, SSA, SSB, Jo-1, centromere all negative HbA1c: 5.9 HIV non reactive MMA wnl Lyme negative RPR negative IFE: no M protein B12: 910 Folate wnl  Vit D (07/22/23) wnl  Imaging/Procedures: Echocardiogram (09/20/2016): Impressions:  - Essentially normal study. Borderline diastolic dysfunction,    possibly normal for age.   MRI brain wo contrast (03/06/20): FINDINGS: Brain: The brain has a normal appearance without evidence of malformation, atrophy, old or acute small or large vessel infarction, mass lesion, hemorrhage, hydrocephalus or extra-axial collection.   Vascular: Major vessels at the base of the brain show flow. Venous sinuses appear patent.   Skull and upper cervical spine: Normal.   Sinuses/Orbits: Clear/normal.   Other: None significant. No fluid in the middle ears or mastoid regions.   IMPRESSION: Normal examination. No abnormality seen to explain the presenting symptoms. [Done for HA and dizziness]  Hip xray (08/13/23): IMPRESSION: No significant osteoarthritis. Mild superolateral left greater than right acetabular degenerative osteophytes but no joint space narrowing.  Lumbar  spine xray (08/13/23): IMPRESSION: 1. Mild L5-S1 facet joint osteoarthritis. 2. Mild L2-3 and L3-4 degenerative endplate osteophytes.  Thoracic spine w/ swimmers xray (08/13/23): IMPRESSION: Mild multilevel degenerative disc and endplate changes throughout the thoracic spine, similar to prior.  Cervical spine xray (08/13/23): IMPRESSION: 1. Mild C4-5, C5-6, and C6-7 degenerative disc and endplate changes, mildly worsened from remote 2007 comparison. 2. Very mild right C5-6 neuroforaminal narrowing secondary to uncovertebral and facet joint hypertrophy, unchanged to mildly worsened from prior.  Right foot xray (08/13/23): IMPRESSION: 1. Minimal great toe metatarsophalangeal joint osteoarthritis. 2. Mild chronic enthesopathic change at the Achilles tendon insertion on the calcaneus.  Left foot xray (08/13/23): IMPRESSION: 1. Minimal great toe metatarsophalangeal joint peripheral degenerative spurring without joint space narrowing. 2. Tiny posterior greater than plantar calcaneal heel spurs.  ASSESSMENT: Dawn [name] is a 68 y.o. female who presents for evaluation of pain in face, neck, arms, back, and legs. She has a relevant medical history of HTN, DM, HLD, vit D deficiency, GERD, OA, osteoporosis, current smoker. Her neurological examination is normal today. Available diagnostic data is significant for extensive lab work up that was normal except positive ANA (see results above). MRI brain in 2022 was also normal. The etiology of patient's diffuse pain is unclear. There is no clear neurologic etiology and symptoms are not consistent with neuropathy and there is no weakness to suggest myopathy. A rheumatologic disorder is possible, but so is a chronic pain disorder such as fibromyalgia. She does have diffuse tenderness to palpation as is often seen in fibromyalgia.  PLAN: -Blood work: CK -Continue gabapentin  600 mg TID -Will start Cymbalta  60 mg daily while waiting to see rheumatology  -Return  to clinic as needed  The impression above as well as the plan as outlined below were extensively discussed with the patient (in the company of daughter's friend and Nurse, learning disability) who voiced understanding. All questions were answered to their satisfaction.  When available, results of the above investigations and possible further recommendations will be communicated to the patient via telephone/MyChart. Patient to call office if not contacted after expected testing turnaround time.   Total time spent reviewing records, interview, history/exam, documentation, and coordination of care on day of encounter:  55 min   Thank you for allowing me to  participate in patient's care.  If I can answer any additional questions, I would be pleased to do so.  Venetia Potters, MD   CC: Jesus Bernardino MATSU, MD [address] [place] [address]  CC: Referring provider: Jesus Bernardino MATSU, MD [address] [place],  [place] [address]

## 2023-08-28 ENCOUNTER — Ambulatory Visit: Admitting: Neurology

## 2023-08-28 ENCOUNTER — Other Ambulatory Visit

## 2023-08-28 ENCOUNTER — Encounter: Payer: Self-pay | Admitting: Neurology

## 2023-08-28 VITALS — BP 120/59 | HR 78 | Ht 61.0 in | Wt 137.0 lb

## 2023-08-28 DIAGNOSIS — R52 Pain, unspecified: Secondary | ICD-10-CM | POA: Diagnosis not present

## 2023-08-28 DIAGNOSIS — M791 Myalgia, unspecified site: Secondary | ICD-10-CM

## 2023-08-28 DIAGNOSIS — R209 Unspecified disturbances of skin sensation: Secondary | ICD-10-CM

## 2023-08-28 MED ORDER — DULOXETINE HCL 60 MG PO CPEP: 60.0000 mg | ORAL_CAPSULE | Freq: Every day | ORAL | 2 refills | Status: AC

## 2023-08-28 NOTE — Patient Instructions (Signed)
 I saw you for pain in your face, neck, arms, back, and legs. I do not see any evidence of a neurologic disorder. This could be a rheumatologic or pain issue. You are seeing rheumatology in December, so they can look for rheumatologic issues at this time.  In the meantime, continue gabapentin  600 mg three times daily. I am also starting another medication that can help this type of pain called Cymbalta . You will take 60 mg once daily.  Please let me know if you have any questions or concerns in the meantime.  The physicians and staff at [facility] are committed to providing excellent care. You may receive a survey requesting feedback about your experience at our office. We strive to receive very good responses to the survey questions. If you feel that your experience would prevent you from giving the office a very good  response, please contact our office to try to remedy the situation. We may be reached at [phone]. Thank you for taking the time out of your busy day to complete the survey.  Venetia Potters, MD [facility]

## 2023-08-29 ENCOUNTER — Ambulatory Visit: Payer: Self-pay | Admitting: Neurology

## 2023-08-29 LAB — CK: Total CK: 118 U/L (ref 20–243)

## 2023-09-02 ENCOUNTER — Other Ambulatory Visit: Payer: Self-pay

## 2023-09-02 ENCOUNTER — Encounter: Payer: Self-pay | Admitting: Internal Medicine

## 2023-09-02 ENCOUNTER — Ambulatory Visit: Admitting: Internal Medicine

## 2023-09-02 VITALS — BP 112/62 | HR 89 | Temp 97.8°F | Ht 61.0 in | Wt 134.0 lb

## 2023-09-02 DIAGNOSIS — R209 Unspecified disturbances of skin sensation: Secondary | ICD-10-CM

## 2023-09-02 DIAGNOSIS — M5441 Lumbago with sciatica, right side: Secondary | ICD-10-CM

## 2023-09-02 DIAGNOSIS — Z789 Other specified health status: Secondary | ICD-10-CM

## 2023-09-02 DIAGNOSIS — M5442 Lumbago with sciatica, left side: Secondary | ICD-10-CM

## 2023-09-02 DIAGNOSIS — G5603 Carpal tunnel syndrome, bilateral upper limbs: Secondary | ICD-10-CM | POA: Diagnosis not present

## 2023-09-02 DIAGNOSIS — G8929 Other chronic pain: Secondary | ICD-10-CM

## 2023-09-02 DIAGNOSIS — Z5986 Financial insecurity: Secondary | ICD-10-CM

## 2023-09-02 MED ORDER — PREDNISONE 20 MG PO TABS: ORAL_TABLET | ORAL | 0 refills | Status: DC
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
## 2023-09-02 NOTE — Telephone Encounter (Signed)
-----   Message from Venetia LITTIE Potters sent at 09/02/2023  1:32 PM EDT ----- Charlies, can we order EMG of bilateral upper extremities for patient?  Danna, can we get her on my schedule? She will need a translator if you are trying to talk to the patient. She may have family that can help as they speak [place].  Thank you both,  Venetia ----- Message ----- From: Jesus Bernardino MATSU, MD Sent: 09/02/2023  11:58 AM EDT To: Venetia LITTIE Potters, MD  Our mutual patient reports severe numbing pain in hands.  I'd request if you could see her back and evaluate this possibly electromyogram. Numbing pain in hands so severe she cries.  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 Will have her trial wrist brace and go ahead and begin hand surgery referral process.

## 2023-09-02 NOTE — Telephone Encounter (Signed)
Called and L/M for a call back

## 2023-09-03 ENCOUNTER — Telehealth: Payer: Self-pay | Admitting: *Deleted

## 2023-09-03 NOTE — Progress Notes (Unsigned)
 Complex Care Management Note Care Guide Note  09/03/2023 Name: Dawn [name] MRN: [id number] DOB: 11-03-1955   Complex Care Management Outreach Attempts: An unsuccessful telephone outreach was attempted today to offer the patient information about available complex care management services.  Follow Up Plan:  Additional outreach attempts will be made to offer the patient complex care management information and services.   Encounter Outcome:  No Answer  Thedford Franks, CMA [place]  [facility], [facility] Guide Direct Dial: [phone]  Fax: [phone] Website: [email or web address]

## 2023-09-03 NOTE — Patient Instructions (Signed)
 VISIT SUMMARY:  You came in today with widespread pain and numbness, particularly in your hands, back, chest, and legs. We discussed your history of carpal tunnel syndrome and a positive ANA test. You mentioned that your symptoms [place]. You are currently taking gabapentin  and have concerns about medication side effects.  YOUR PLAN:  -CARPAL TUNNEL SYNDROME: Carpal tunnel syndrome is a condition where the median nerve in your wrist is compressed, causing numbness, tingling, and pain in your hands. We recommend wearing a wrist brace during activities and at night to prevent further nerve damage. Oral prednisone  is prescribed to see if it helps with your pain. We will refer you to a neurologist for an EMG test to confirm the diagnosis and to a hand surgeon for a possible steroid injection if needed.  -SCIATICA: Sciatica is pain that radiates along the path of the sciatic nerve, which runs from your lower back through your hips and buttocks and down each leg. This may be related to your previous fall. We will refer you to a physical medicine and rehabilitation specialist for pain management. Physical therapy may also be an option, depending on your financial situation. Oral prednisone  is prescribed to see if it helps with your pain.  -FINANCIAL CONCERNS: You have concerns about the costs of medical care, especially copays for physical therapy and specialist visits. We will refer you to a social worker at the [facility] to help you apply for Medicaid, which may reduce your expenses.  INSTRUCTIONS:  Please follow up with the neurologist for the EMG test and the hand surgeon for a possible steroid injection after the EMG confirmation. Also, follow up with the physical medicine and rehabilitation specialist for pain management. Make sure to contact the social worker at the [facility] for assistance with your Medicaid application.

## 2023-09-03 NOTE — Assessment & Plan Note (Signed)
 Used interpreter and she brought daughter who helps. But still the language barrier is very challenging due to rare dialect.

## 2023-09-04 NOTE — Progress Notes (Unsigned)
 Complex Care Management Note Care Guide Note  09/04/2023 Name: Dawn [name] MRN: [id number] DOB: 1955/10/14   Complex Care Management Outreach Attempts: A second unsuccessful outreach was attempted today to offer the patient with information about available complex care management services.  Follow Up Plan:  Additional outreach attempts will be made to offer the patient complex care management information and services.   Encounter Outcome:  No Answer  Thedford Franks, CMA [place]  [facility], [facility] Guide Direct Dial: [phone]  Fax: [phone] Website: [email or web address]

## 2023-09-05 NOTE — Progress Notes (Signed)
 Complex Care Management Note Care Guide Note  09/05/2023 Name: Dawn [name] MRN: [id number] DOB: 11-08-1955   Complex Care Management Outreach Attempts: A third unsuccessful outreach was attempted today to offer the patient with information about available complex care management services.  Follow Up Plan:  No further outreach attempts will be made at this time. We have been unable to contact the patient to offer or enroll patient in complex care management services.  Encounter Outcome:  No Answer  Thedford Franks, CMA [place]  [facility], [facility] Guide Direct Dial: [phone]  Fax: [phone] Website: [email or web address]

## 2023-10-03 ENCOUNTER — Ambulatory Visit: Admitting: Internal Medicine

## 2023-10-03 ENCOUNTER — Encounter: Payer: Self-pay | Admitting: Internal Medicine

## 2023-10-03 VITALS — BP 120/66 | HR 70 | Temp 98.0°F | Ht 61.0 in | Wt 138.0 lb

## 2023-10-03 DIAGNOSIS — G609 Hereditary and idiopathic neuropathy, unspecified: Secondary | ICD-10-CM | POA: Diagnosis not present

## 2023-10-03 DIAGNOSIS — G5603 Carpal tunnel syndrome, bilateral upper limbs: Secondary | ICD-10-CM | POA: Diagnosis not present

## 2023-10-03 DIAGNOSIS — Z789 Other specified health status: Secondary | ICD-10-CM

## 2023-10-03 DIAGNOSIS — G8929 Other chronic pain: Secondary | ICD-10-CM

## 2023-10-03 DIAGNOSIS — M5442 Lumbago with sciatica, left side: Secondary | ICD-10-CM

## 2023-10-03 DIAGNOSIS — G959 Disease of spinal cord, unspecified: Secondary | ICD-10-CM | POA: Diagnosis not present

## 2023-10-03 DIAGNOSIS — M79672 Pain in left foot: Secondary | ICD-10-CM

## 2023-10-03 DIAGNOSIS — M79671 Pain in right foot: Secondary | ICD-10-CM
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
## 2023-10-04 DIAGNOSIS — M79671 Pain in right foot: Secondary | ICD-10-CM | POA: Insufficient documentation

## 2023-10-04 DIAGNOSIS — G959 Disease of spinal cord, unspecified: Secondary | ICD-10-CM | POA: Insufficient documentation

## 2023-10-04 DIAGNOSIS — G8929 Other chronic pain: Secondary | ICD-10-CM | POA: Insufficient documentation
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
## 2023-10-04 NOTE — Assessment & Plan Note (Signed)
 Reviewed XR - updated problem overview for this problem to improve longitudinal management

## 2023-10-04 NOTE — Assessment & Plan Note (Signed)
 Chronic back pain with radiculopathy and muscle spasm   Chronic back pain with radiculopathy and muscle spasm is likely due to arthritis and nerve pinching in the spine. Positive ANA and ESR suggest inflammation. Pain extends from the feet to the chest, with muscle spasms in the upper back. Physical therapy was previously attempted without significant improvement, but medications provide relief. Continue gabapentin  and duloxetine  for pain management. Refer to a pain management clinic. Order an MRI of the neck to assess for nerve involvement, contingent on insurance approval without further physical therapy. Request physical therapy records from [facility] to support MRI approval.

## 2023-10-24 ENCOUNTER — Other Ambulatory Visit: Payer: Self-pay | Admitting: Internal Medicine

## 2023-10-24 DIAGNOSIS — G8929 Other chronic pain: Secondary | ICD-10-CM

## 2023-10-24 DIAGNOSIS — G5603 Carpal tunnel syndrome, bilateral upper limbs: Secondary | ICD-10-CM

## 2023-10-26 ENCOUNTER — Ambulatory Visit
Admission: RE | Admit: 2023-10-26 | Discharge: 2023-10-26 | Disposition: A | Discharge status: Home / Self Care | Source: Ambulatory Visit | Attending: Internal Medicine | Admitting: Internal Medicine

## 2023-10-26 DIAGNOSIS — G959 Disease of spinal cord, unspecified: Secondary | ICD-10-CM

## 2023-10-27 ENCOUNTER — Ambulatory Visit: Payer: Self-pay | Admitting: Internal Medicine

## 2023-10-27 NOTE — Telephone Encounter (Signed)
 Will make results and advise pt to call office back

## 2023-10-27 NOTE — Telephone Encounter (Signed)
 Tried to call pt no answer did not leave message.

## 2023-10-28 ENCOUNTER — Other Ambulatory Visit: Payer: Self-pay

## 2023-10-28 ENCOUNTER — Telehealth: Payer: Self-pay | Admitting: Neurology

## 2023-10-28 ENCOUNTER — Ambulatory Visit: Admitting: Neurology

## 2023-10-28 DIAGNOSIS — R209 Unspecified disturbances of skin sensation: Secondary | ICD-10-CM

## 2023-10-28 DIAGNOSIS — G5603 Carpal tunnel syndrome, bilateral upper limbs: Secondary | ICD-10-CM

## 2023-10-28 DIAGNOSIS — R52 Pain, unspecified: Secondary | ICD-10-CM

## 2023-10-28 NOTE — Procedures (Signed)
 [facility]  [address] [place], [address]  [place], [place] [address] Tel: [phone] Fax: [phone] Test Date:  10/28/2023  Patient: Dawn [name] DOB: May 29, 1955 Physician: Venetia Potters, MD  Sex: Female Height: 5' 1 Ref Phys: Bernardino Cone, MD  ID#: [id number]   Technician:    History: This is a 68 year old female with pain in both hands.  NCV & EMG Findings: Extensive electrodiagnostic evaluation of bilateral upper limbs shows: Bilateral median sensory responses are absent. Bilateral ulnar and radial sensory responses are within normal limits. Bilateral median (APB) motor responses show prolonged distal onset latency (L4.6, R4.6 ms). Bilateral ulnar (ADM) motor responses are within normal limits. Chronic motor axon loss changes without accompanying active denervation changes are seen in bilateral abductor pollicis brevis muscles.  Impression: This is an abnormal study. 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 No electrodiagnostic evidence of left or right cervical (C5-T1) motor radiculopathy. Screening studies for left or right ulnar or radial mononeuropathies are normal.    ___________________________ Venetia Potters, MD    Nerve Conduction Studies Motor Nerve Results    Latency Amplitude F-Lat Segment Distance CV Comment  Site (ms) Norm (mV) Norm (ms)  (cm) (m/s) Norm   Left Median (APB) Motor  Wrist *4.6  < 4.0 6.7  > 5.0        Elbow 9.6 - 6.5 -  Elbow-Wrist 26 52  > 50   Right Median (APB) Motor  Wrist *4.6  < 4.0 6.4  > 5.0        Elbow 9.5 - 6.0 -  Elbow-Wrist 25.5 52  > 50   Left Ulnar (ADM) Motor  Wrist 2.0  < 3.1 9.1  > 7.0        Bel elbow 5.9 - 8.4 -  Bel elbow-Wrist 20 51  > 50   Ab elbow 7.7 - 8.3 -  Ab elbow-Bel elbow 10 56 -   Right Ulnar (ADM) Motor  Wrist 2.3  < 3.1 10.3  > 7.0        Bel elbow 6.0 -  8.9 -  Bel elbow-Wrist 20 54  > 50   Ab elbow 7.8 - 8.6 -  Ab elbow-Bel elbow 10 56 -    Sensory Sites    Neg Peak Lat Amplitude (O-P) Segment Distance Velocity Comment  Site (ms) Norm (V) Norm  (cm) (ms)   Left Median Sensory  Wrist-Dig II *NR  < 3.8 *NR  > 10 Wrist-Dig II 13    Right Median Sensory  Wrist-Dig II *NR  < 3.8 *NR  > 10 Wrist-Dig II 13    Left Radial Sensory  Forearm-Wrist 2.3  < 2.8 20  > 10 Forearm-Wrist 10    Right Radial Sensory  Forearm-Wrist 2.1  < 2.8 18  > 10 Forearm-Wrist 10    Left Ulnar Sensory  Wrist-Dig V 2.8  < 3.2 27  > 5 Wrist-Dig V 11    Right Ulnar Sensory  Wrist-Dig V 2.9  < 3.2 20  > 5 Wrist-Dig V 11     Electromyography   Side Muscle Ins.Act Fibs Fasc Recrt Amp Dur Poly Activation Comment  Right FDI Nml Nml Nml Nml Nml Nml Nml Nml N/A  Right EIP Nml Nml Nml Nml Nml Nml Nml Nml N/A  Right Pronator teres Nml Nml Nml Nml Nml Nml Nml Nml N/A  Right APB Nml Nml Nml *1- *1+ *1+ *1+ Nml N/A  Right Biceps Nml Nml Nml Nml Nml Nml Nml Nml N/A  Right Triceps Nml Nml Nml Nml Nml Nml Nml Nml N/A  Right Deltoid Nml Nml Nml Nml Nml Nml Nml Nml N/A  Left FDI Nml Nml Nml Nml Nml Nml Nml Nml N/A  Left EIP Nml Nml Nml Nml Nml Nml Nml Nml N/A  Left Pronator teres Nml Nml Nml Nml Nml Nml Nml Nml N/A  Left APB Nml Nml Nml *1- *1+ *1+ *1+ Nml N/A  Left Biceps Nml Nml Nml Nml Nml Nml Nml Nml N/A  Left Triceps Nml Nml Nml Nml Nml Nml Nml Nml N/A  Left Deltoid Nml Nml Nml Nml Nml Nml Nml Nml N/A      Waveforms:  Motor           Sensory

## 2023-10-28 NOTE — Telephone Encounter (Signed)
 Discussed the results of patient's EMG after the procedure today with the help of medical translator. It showed bilateral severe carpal tunnel syndrome. She is wearing braces with mild benefit. Given the severe nature of findings, I recommended she consider hand surgery referral, which she agreed to. I will place this referral today.  All questions were answered.  Venetia Potters, MD [facility]

## 2023-11-25 ENCOUNTER — Other Ambulatory Visit: Payer: Self-pay | Admitting: Internal Medicine

## 2023-12-30 ENCOUNTER — Ambulatory Visit

## 2024-01-05 ENCOUNTER — Encounter: Payer: Self-pay | Admitting: Internal Medicine

## 2024-01-05 ENCOUNTER — Ambulatory Visit (INDEPENDENT_AMBULATORY_CARE_PROVIDER_SITE_OTHER): Admitting: Internal Medicine

## 2024-01-05 VITALS — BP 130/62 | HR 76 | Temp 98.3°F | Ht 61.0 in | Wt 148.2 lb

## 2024-01-05 DIAGNOSIS — I10 Essential (primary) hypertension: Secondary | ICD-10-CM

## 2024-01-05 DIAGNOSIS — G959 Disease of spinal cord, unspecified: Secondary | ICD-10-CM

## 2024-01-05 DIAGNOSIS — M791 Myalgia, unspecified site: Secondary | ICD-10-CM

## 2024-01-05 DIAGNOSIS — G629 Polyneuropathy, unspecified: Secondary | ICD-10-CM | POA: Diagnosis not present

## 2024-01-05 DIAGNOSIS — M810 Age-related osteoporosis without current pathological fracture: Secondary | ICD-10-CM | POA: Diagnosis not present

## 2024-01-05 DIAGNOSIS — H538 Other visual disturbances: Secondary | ICD-10-CM | POA: Diagnosis not present

## 2024-01-05 DIAGNOSIS — Z789 Other specified health status: Secondary | ICD-10-CM

## 2024-01-05 MED ORDER — ALENDRONATE SODIUM 70 MG PO TABS: 70.0000 mg | ORAL_TABLET | ORAL | 3 refills | Status: AC

## 2024-01-05 MED ORDER — BACLOFEN 10 MG PO TABS: 10.0000 mg | ORAL_TABLET | Freq: Two times a day (BID) | ORAL | 3 refills | Status: DC | PRN

## 2024-01-05 MED ORDER — AMLODIPINE BESYLATE 2.5 MG PO TABS
2.5000 mg | ORAL_TABLET | Freq: Every day | ORAL | 2 refills | Status: AC
Start: 2024-01-05 — End: ?

## 2024-01-05 NOTE — Assessment & Plan Note (Signed)
 Cervical myelopathy with chronic low back pain and sciatica   Chronic cervical myelopathy, likely from past spinal damage, causes low back pain and sciatica. MRI shows cervical disc thinning and nerve compression, correlating with foot pain and blurry vision. Surgical intervention may relieve pain and prevent future injuries. She continues gabapentin  for nerve pain and baclofen  for muscle spasms. Referred to neurosurgery for surgical evaluation and to physical medicine and rehabilitation for spine management. An MRI of the lumbar spine is ordered to assess additional damage. Referred to ophthalmology for blurry vision evaluation.  Reviewed the MRI with images and independently explained and interpreted the imaging findings to the patient, with interpreter   Blurry vision may relate to cervical myelopathy and increased intracranial pressure from neck bending because she reports they flare together. Referred to ophthalmology to rule out primary eye pathology.

## 2024-01-05 NOTE — Assessment & Plan Note (Signed)
Due to language barrier, an interpreter  was present during the history-taking and subsequent discussion with this patient.  

## 2024-01-05 NOTE — Assessment & Plan Note (Signed)
 Reviewed available data from patient and  BP Readings from Last 3 Encounters:  01/05/24 130/62  10/03/23 120/66  09/02/23 112/62  My individualized, goal average blood pressure for this patient, after considering the evidence for and against aggressive blood pressure goals as well as their past medical history and preferences, is 140/90 In my medical opinion, this problem is stable, well controlled  Will continue the current medication(s), unchanged:  Current hypertension medications:       Sig   losartan  (COZAAR ) 100 MG tablet (Taking) Take 1 tablet (100 mg total) by mouth daily.   amLODipine  (NORVASC ) 2.5 MG tablet Take 1 tablet (2.5 mg total) by mouth daily.

## 2024-01-05 NOTE — Patient Instructions (Addendum)
 It was a pleasure seeing you today! Your health and satisfaction are our top priorities.  Dawn Cone, MD  VISIT SUMMARY: During your visit, we discussed your chronic back pain, foot symptoms, and blurry vision. We reviewed your MRI results and current medications, and we have made several referrals and recommendations to help manage your symptoms and improve your overall health.  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 We will continue your current medications, gabapentin  for nerve pain and baclofen  for muscle spasms. You have been referred to neurosurgery for a surgical evaluation and to physical medicine and rehabilitation for spine management. An MRI of your lower back has been ordered to check for additional damage.  -OSTEOPOROSIS WITHOUT CURRENT PATHOLOGICAL FRACTURE: Osteoporosis is a condition where bones become weak and brittle. We have refilled your prescription for alendronate  to help maintain your bone health.  -OTHER VISUAL DISTURBANCES: Your blurry vision may be related to your neck issues. You have been referred to an ophthalmologist to rule out any primary eye problems.  -GENERAL HEALTH MAINTENANCE: We discussed estrogen replacement therapy to help with your post-menopausal bone health.  INSTRUCTIONS: Please follow up with the neurosurgeon and physical medicine and rehabilitation as referred. Schedule an MRI for your lower back and an appointment with the ophthalmologist for your blurry vision. Continue taking your medications as prescribed and start your alendronate  again for bone health.  Your Providers PCP: [name] Dawn MATSU, MD,  [phone]) Referring Provider: [name], MD,  [phone])  NEXT STEPS: [x]  Early Intervention: Schedule sooner appointment, call our on-call services, or go to  emergency room if there is any significant Increase in pain or discomfort New or worsening symptoms Sudden or severe changes in your health [x]  Flexible Follow-Up: We recommend a Return in about 2 months (around 03/06/2024) for chronic disease monitoring and management. for optimal routine care. This allows for progress monitoring and treatment adjustments. [x]  Preventive Care: Schedule your annual preventive care visit! It's typically covered by insurance and helps identify potential health issues early. [x]  Lab & X-ray Appointments: Incomplete tests scheduled today, or call to schedule. X-rays: [facility] (M-F, 8:30am-noon or 1pm-5pm). [x]  Medical Information Release: Sign a release form at front desk to obtain relevant medical information we don't have.  MAKING THE MOST OF OUR FOCUSED 20 MINUTE APPOINTMENTS: [x]   Clearly state your top concerns at the beginning of the visit to focus our discussion [x]   If you anticipate you will need more time, please inform the front desk during scheduling - we can book multiple appointments in the same week. [x]   If you have transportation problems- use our convenient video appointments or ask about transportation support. [x]   We can get down to business faster if you use MyChart to update information before the visit and submit non-urgent questions before your visit. Thank you for taking the time to provide details through MyChart.  Let our nurse know and she can import this information into your encounter documents.  Arrival and Wait Times: [x]   Arriving on time ensures that everyone receives prompt attention. [x]   Early morning (8a) and afternoon (1p) appointments tend to have shortest wait times. [x]   Unfortunately, we cannot delay appointments for late arrivals or hold slots during phone calls.  Getting Answers and Following Up [x]   Simple Questions &  Concerns: For quick questions or basic follow-up after your visit, reach us  at [phone]  [phone] or MyChart messaging. [x]   Complex Concerns: If your concern is more complex, scheduling an appointment might be best. Discuss this with the staff to find the most suitable option. [x]   Lab & Imaging Results: We'll contact you directly if results are abnormal or you don't use MyChart. Most normal results will be on MyChart within 2-3 business days, with a review message from Dr. Jesus. Haven't heard back in 2 weeks? Need results sooner? Contact us  at [phone]. [x]   Referrals: Our referral coordinator will manage specialist referrals. The specialist's office should contact you within 2 weeks to schedule an appointment. Call us  if you haven't heard from them after 2 weeks.  Staying Connected [x]   MyChart: Activate your MyChart for the fastest way to access results and message us . See the last page of this paperwork for instructions on how to activate.  Bring to Your Next Appointment [x]   Medications: Please bring all your medication bottles to your next appointment to ensure we have an accurate record of your prescriptions. [x]   Health Diaries: If you're monitoring any health conditions at home, keeping a diary of your readings can be very helpful for discussions at your next appointment.  Billing [x]   X-ray & Lab Orders: These are billed by separate companies. Contact the invoicing company directly for questions or concerns. [x]   Visit Charges: Discuss any billing inquiries with our administrative services team.  Your Satisfaction Matters [x]   Share Your Experience: We strive for your satisfaction! If you have any complaints, or preferably compliments, please let Dr. Jesus know directly or contact our Practice Administrators, Manuelita Rubin or [employer], by asking at the front desk.   Reviewing Your Records [x]   Review this early draft of your clinical encounter notes below and the final encounter summary tomorrow on MyChart after its been completed.  All orders placed so  far are visible here: Cervical myelopathy [facility]) -     Ambulatory referral to Neurosurgery -     Ambulatory referral to Physical Medicine Rehab -     MR LUMBAR SPINE WO CONTRAST; Future  Blurry vision -     Ambulatory referral to Neurosurgery  Myalgia -     Baclofen ; Take 1 tablet (10 mg total) by mouth every 12 (twelve) hours as needed for muscle spasms.  Dispense: 180 each; Refill: 3 -     amLODIPine  Besylate; Take 1 tablet (2.5 mg total) by mouth daily.  Dispense: 30 tablet; Refill: 2 -     Ambulatory referral to Physical Medicine Rehab -     MR LUMBAR SPINE WO CONTRAST; Future  Primary hypertension -     amLODIPine  Besylate; Take 1 tablet (2.5 mg total) by mouth daily.  Dispense: 30 tablet; Refill: 2  Neuropathy -     Ambulatory referral to Physical Medicine Rehab -     MR LUMBAR SPINE WO CONTRAST; Future  Osteoporosis, unspecified osteoporosis type, unspecified pathological fracture presence -     Alendronate  Sodium; Take 1 tablet (70 mg total) by mouth once a week.  Dispense: 13 tablet; Refill: 3  Language barrier to communication

## 2024-01-05 NOTE — Assessment & Plan Note (Signed)
 Osteoporosis may contribute to spinal issues. Alendronate  is prescribed for bone health, and the prescription has been refilled. General Health Maintenance   Discussed estrogen replacement therapy for post-menopausal bone health;

## 2024-01-05 NOTE — Progress Notes (Signed)
 ==============================  [facility]: [phone]   -- Medical Office Visit --  Patient: Dawn [name]      Age: 68 y.o.       Sex:  female  Date:   01/05/2024 Today's Healthcare Provider: Bernardino KANDICE Cone, MD  ==============================   Chief Complaint: right foot (Pt states both feet warm under foot up legs and painful started yesterday ), Back Pain (Back pain today ), Shoulder Pain (Having a lot of shoulder pain), and discuss results (On head pt states )  Discussed the use of AI scribe software for clinical note transcription with the patient, who gave verbal consent to proceed.  History of Present Illness Dawn [name] is a 68 year old female with chronic back pain who presents with worsening pain and foot symptoms.  She has chronic back pain that radiates to the bottom of her feet and abdomen, originating from the back of her right side. This pain has been persistent since she fell [place] a [place]  An MRI of the cervical spine showed thinning of the discs and ruffled edges of the bones. She has not yet had an MRI of the lower back.  She is currently taking gabapentin  at the maximum dose for foot pain and baclofen  for muscle spasms in the neck and back. She has a history of bone weakness, possibly related to menopause, and has been prescribed alendronate  for bone health, but she has run out of this medication and needs a refill.  She experiences blurry vision, which she associates with neck pain and bending. No recent eye doctor visits. Her social history includes fleeing persecution and [place], which involved walking on slippery stones, contributing to her current back issues.  Background Reviewed: Problem List: has CHF (congestive heart failure) (HCC); Chest pain; Arthralgia; Hypertension; Healthcare maintenance; Hypokalemia; Epigastric pain; Numbness; Emesis;  Gastroesophageal reflux disease without esophagitis; Vitamin D  deficiency; Osteoporosis; Hyperlipidemia; Neuropathy; History of anemia; Language barrier to communication; Onychomycosis; External hemorrhoids; Family history of sickle cell trait; Atherosclerosis of aorta; Multilevel degenerative disc disease; Osteoarthritis; Positive ANA (antinuclear antibody); Elevated sed rate; Prediabetes; Foot pain, bilateral; Chronic left-sided low back pain with left-sided sciatica; and Cervical myelopathy (HCC) on their problem list. Past Medical History:  has a past medical history of Acute hyponatremia (03/06/2020), Arthritis, Diabetes mellitus without complication (HCC), Elevated BP without diagnosis of hypertension, and Hypertension. Past Surgical History:   has a past surgical history that includes Cesarean section. Social History:   reports that she has quit smoking. Her smoking use included cigarettes. She has never used smokeless tobacco. She reports that she does not drink alcohol and does not use drugs. Family History:  family history includes Chorea in her father. Allergies:  is allergic to thiazide-type diuretics.   Medication Reconciliation: Current Outpatient Medications on File Prior to Visit  Medication Sig   atorvastatin  (LIPITOR) 40 MG tablet Take 1 tablet (40 mg total) by mouth daily.   DULoxetine  (CYMBALTA ) 60 MG capsule Take 1 capsule (60 mg total) by mouth daily.   ergocalciferol  (VITAMIN D2) 1.25 MG (50000 UT) capsule Take 1 capsule (50,000 Units total) by mouth once a week.   gabapentin  (NEURONTIN ) 600 MG tablet TAKE 1 TABLET(600 MG) BY MOUTH THREE TIMES DAILY   losartan  (COZAAR ) 100 MG tablet Take 1 tablet (100 mg total) by mouth daily.   metFORMIN  (GLUCOPHAGE ) 500 MG tablet Take 1 tablet (500  mg total) by mouth daily.   pantoprazole  (PROTONIX ) 40 MG tablet Take 1 tablet (40 mg total) by mouth daily.   Cyanocobalamin  (B-12) 1000 MCG SUBL Place 1 tablet under the tongue daily at 6 (six)  AM.   lidocaine -prilocaine  (EMLA ) cream Apply topically 2 (two) times daily. (Patient not taking: Reported on 01/05/2024)   lidocaine -prilocaine  (EMLA ) cream Apply 1 Application topically as needed.   predniSONE  (DELTASONE ) 20 MG tablet TAKE 2 TABLETS BY MOUTH DAILY FOR 3 DAYS THEN TAKE 1 TABLET BY MOUTH DAILY FOR 4 DAYS (Patient not taking: Reported on 01/05/2024)   Current Facility-Administered Medications on File Prior to Visit  Medication   cyanocobalamin  (VITAMIN B12) injection 1,000 mcg   Medications Discontinued During This Encounter  Medication Reason   alendronate  (FOSAMAX ) 70 MG tablet Reorder   amLODipine  (NORVASC ) 2.5 MG tablet Reorder   baclofen  (LIORESAL ) 10 MG tablet Reorder     Physical Exam:    01/05/2024    9:54 AM 10/03/2023    9:44 AM 09/02/2023   11:23 AM  Vitals with BMI  Height 5' 1 5' 1 5' 1  Weight 148 lbs 3 oz 138 lbs 134 lbs  BMI 28.02 26.09 25.33  Systolic 130 120 887  Diastolic 62 66 62  Pulse 76 70 89  Vital signs reviewed.  Nursing notes reviewed. Weight trend reviewed. Physical Activity: Not on File (05/31/2021)   Received from [facility]   Physical Activity    Physical Activity: 0   General Appearance:  No acute distress appreciable.   Well-groomed, healthy-appearing female.  Well proportioned with no abnormal fat distribution.  Good muscle tone. Pulmonary:  Normal work of breathing at rest, no respiratory distress apparent. SpO2: 98 %  Musculoskeletal: All extremities are intact.  Neurological:  Awake, alert, oriented, and engaged.  No obvious focal neurological deficits or cognitive impairments.  Sensorium seems unclouded.   Speech is clear and coherent with logical content. Psychiatric:  Appropriate mood, pleasant and cooperative demeanor, thoughtful and engaged during the exam   Verbalized to patient: Physical Exam    Results:   Verbalized to patient: Results RADIOLOGY Cervical spine MRI: Disc desiccation, osteophyte formation,  spinal cord compression with nerve root impingement.     07/22/2023   10:53 AM 08/09/2019   11:47 AM 06/04/2019   11:26 AM 06/04/2019   11:02 AM  PHQ 2/9 Scores  PHQ - 2 Score 0 3 0 0  PHQ- 9 Score  16  1       Data saved with a previous flowsheet row definition    Office Visit on 08/28/2023  Component Date Value Ref Range Status   Total CK 08/28/2023 118  20 - 243 U/L Final  Office Visit on 08/13/2023  Component Date Value Ref Range Status   WBC 08/13/2023 7.2  4.0 - 10.5 K/uL Final   RBC 08/13/2023 4.45  3.87 - 5.11 Mil/uL Final   Hemoglobin 08/13/2023 12.0  12.0 - 15.0 g/dL Final   HCT 92/97/7974 37.0  36.0 - 46.0 % Final   MCV 08/13/2023 83.2  78.0 - 100.0 fl Final   MCHC 08/13/2023 32.5  30.0 - 36.0 g/dL Final   RDW 92/97/7974 14.4  11.5 - 15.5 % Final   Platelets 08/13/2023 231.0  150.0 - 400.0 K/uL Final   Neutrophils Relative % 08/13/2023 59.7  43.0 - 77.0 % Final   Lymphocytes Relative 08/13/2023 34.2  12.0 - 46.0 % Final   Monocytes Relative 08/13/2023 5.2  3.0 - 12.0 %  Final   Eosinophils Relative 08/13/2023 0.2  0.0 - 5.0 % Final   Basophils Relative 08/13/2023 0.7  0.0 - 3.0 % Final   Neutro Abs 08/13/2023 4.3  1.4 - 7.7 K/uL Final   Lymphs Abs 08/13/2023 2.5  0.7 - 4.0 K/uL Final   Monocytes Absolute 08/13/2023 0.4  0.1 - 1.0 K/uL Final   Eosinophils Absolute 08/13/2023 0.0  0.0 - 0.7 K/uL Final   Basophils Absolute 08/13/2023 0.1  0.0 - 0.1 K/uL Final   Sodium 08/13/2023 139  135 - 145 mEq/L Final   Potassium 08/13/2023 3.9  3.5 - 5.1 mEq/L Final   Chloride 08/13/2023 104  96 - 112 mEq/L Final   CO2 08/13/2023 28  19 - 32 mEq/L Final   Glucose, Bld 08/13/2023 135 (H)  70 - 99 mg/dL Final   BUN 92/97/7974 10  6 - 23 mg/dL Final   Creatinine, Ser 08/13/2023 0.91  0.40 - 1.20 mg/dL Final   Total Bilirubin 08/13/2023 0.4  0.2 - 1.2 mg/dL Final   Alkaline Phosphatase 08/13/2023 57  39 - 117 U/L Final   AST 08/13/2023 22  0 - 37 U/L Final   ALT 08/13/2023 12  0 -  35 U/L Final   Total Protein 08/13/2023 7.7  6.0 - 8.3 g/dL Final   Albumin 92/97/7974 4.3  3.5 - 5.2 g/dL Final   GFR 92/97/7974 65.37  >60.00 mL/min Final   Calcium  08/13/2023 9.2  8.4 - 10.5 mg/dL Final   Vitamin A-87 92/97/7974 910  211 - 911 pg/mL Final   Folate 08/13/2023 12.9  >5.9 ng/mL Final   Total Protein 08/13/2023 7.3  6.1 - 8.1 g/dL Final   Albumin ELP 92/97/7974 4.1  3.8 - 4.8 g/dL Final   Alpha 1 92/97/7974 0.3  0.2 - 0.3 g/dL Final   Alpha 2 92/97/7974 0.7  0.5 - 0.9 g/dL Final   Beta Globulin 92/97/7974 0.5  0.4 - 0.6 g/dL Final   Beta 2 92/97/7974 0.4  0.2 - 0.5 g/dL Final   Gamma Globulin 08/13/2023 1.3  0.8 - 1.7 g/dL Final   SPE Interp. 92/97/7974    Final   RPR Ser Ql 08/13/2023 NON-REACTIVE  NON-REACTIVE Final   Lyme Total Antibody EIA 08/13/2023 Negative  Negative Final   Methylmalonic Acid, Quant 08/13/2023 115  69 - 390 nmol/L Final   HIV FINAL INTERPRETATION 08/13/2023    Final   HIV 1&2 Ab, 4th Generation 08/13/2023 NON-REACTIVE  NON-REACTIVE Final   Hgb A1c MFr Bld 08/13/2023 5.9  4.6 - 6.5 % Final   Anti Nuclear Antibody (ANA) 08/13/2023 Positive (A)  Negative Final   dsDNA Ab 08/13/2023 <1  0 - 9 IU/mL Final   ENA RNP Ab 08/13/2023 0.2  0.0 - 0.9 AI Final   ENA SM Ab Ser-aCnc 08/13/2023 <0.2  0.0 - 0.9 AI Final   Scleroderma (Scl-70) (ENA) Antibod* 08/13/2023 <0.2  0.0 - 0.9 AI Final   ENA SSA (RO) Ab 08/13/2023 <0.2  0.0 - 0.9 AI Final   ENA SSB (LA) Ab 08/13/2023 <0.2  0.0 - 0.9 AI Final   Chromatin Ab SerPl-aCnc 08/13/2023 <0.2  0.0 - 0.9 AI Final   Anti JO-1 08/13/2023 <0.2  0.0 - 0.9 AI Final   Centromere Ab Screen 08/13/2023 <0.2  0.0 - 0.9 AI Final   See below: 08/13/2023 Comment   Final   Magnesium  08/13/2023 2.1  1.5 - 2.5 mg/dL Final   CRP 92/97/7974 <1.0  0.5 - 20.0 mg/dL  Final   Vitamin B1 (Thiamine) 08/13/2023 8  8 - 30 nmol/L Final   Sed Rate 08/13/2023 31 (H)  0 - 30 mm/hr Final  Office Visit on 07/22/2023  Component Date Value Ref  Range Status   Methylmalonic Acid, Quant 07/22/2023 131  69 - 390 nmol/L Final   Vitamin B-12 07/22/2023 309  211 - 911 pg/mL Final   VITD 07/22/2023 35.22  30.00 - 100.00 ng/mL Final  Admission on 06/25/2023, Discharged on 06/25/2023  Component Date Value Ref Range Status   Influenza A Antigen, POC 06/25/2023 Negative  Negative Final   Influenza B Antigen, POC 06/25/2023 Negative  Negative Final   Covid Antigen, POC 06/25/2023 Negative  Negative Final  No image results found. NCV with EMG(electromyography) Result Date: 10/28/2023 Leigh Venetia CROME, MD     10/28/2023  9:50 AM [facility] [address] [place], [address]  [place], [place] [address] Tel: [phone] Fax: [phone] Test Date:  10/28/2023 Patient: Kamala Kolton DOB: 1955-10-13 Physician: Venetia Leigh, MD Sex: Female Height: 5' 1 Ref Phys: Bernardino Cone, MD ID#: [id number]   Technician:  History: This is a 68 year old female with pain in both hands. NCV & EMG Findings: Extensive electrodiagnostic evaluation of bilateral upper limbs shows: Bilateral median sensory responses are absent. Bilateral ulnar and radial sensory responses are within normal limits. Bilateral median (APB) motor responses show prolonged distal onset latency (L4.6, R4.6 ms). Bilateral ulnar (ADM) motor responses are within normal limits. Chronic motor axon loss changes without accompanying active denervation changes are seen in bilateral abductor pollicis brevis muscles. Impression: This is an abnormal study. 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 No electrodiagnostic evidence of left or right cervical (C5-T1) motor radiculopathy. Screening studies for left or right ulnar or radial mononeuropathies are normal. ___________________________ Venetia Leigh, MD Nerve Conduction Studies Motor Nerve Results   Latency Amplitude F-Lat Segment  Distance CV Comment Site (ms) Norm (mV) Norm (ms)  (cm) (m/s) Norm  Left Median (APB) Motor Wrist *4.6  < 4.0 6.7  > 5.0       Elbow 9.6 - 6.5 -  Elbow-Wrist 26 52  > 50  Right Median (APB) Motor Wrist *4.6  < 4.0 6.4  > 5.0       Elbow 9.5 - 6.0 -  Elbow-Wrist 25.5 52  > 50  Left Ulnar (ADM) Motor Wrist 2.0  < 3.1 9.1  > 7.0       Bel elbow 5.9 - 8.4 -  Bel elbow-Wrist 20 51  > 50  Ab elbow 7.7 - 8.3 -  Ab elbow-Bel elbow 10 56 -  Right Ulnar (ADM) Motor Wrist 2.3  < 3.1 10.3  > 7.0       Bel elbow 6.0 - 8.9 -  Bel elbow-Wrist 20 54  > 50  Ab elbow 7.8 - 8.6 -  Ab elbow-Bel elbow 10 56 -  Sensory Sites   Neg Peak Lat Amplitude (O-P) Segment Distance Velocity Comment Site (ms) Norm (V) Norm  (cm) (ms)  Left Median Sensory Wrist-Dig II *NR  < 3.8 *NR  > 10 Wrist-Dig II 13   Right Median Sensory Wrist-Dig II *NR  < 3.8 *NR  > 10 Wrist-Dig II 13   Left Radial Sensory Forearm-Wrist 2.3  < 2.8 20  > 10 Forearm-Wrist 10   Right Radial Sensory Forearm-Wrist 2.1  <  2.8 18  > 10 Forearm-Wrist 10   Left Ulnar Sensory Wrist-Dig V 2.8  < 3.2 27  > 5 Wrist-Dig V 11   Right Ulnar Sensory Wrist-Dig V 2.9  < 3.2 20  > 5 Wrist-Dig V 11   Electromyography  Side Muscle Ins.Act Fibs Fasc Recrt Amp Dur Poly Activation Comment Right FDI Nml Nml Nml Nml Nml Nml Nml Nml N/A Right EIP Nml Nml Nml Nml Nml Nml Nml Nml N/A Right Pronator teres Nml Nml Nml Nml Nml Nml Nml Nml N/A Right APB Nml Nml Nml *1- *1+ *1+ *1+ Nml N/A Right Biceps Nml Nml Nml Nml Nml Nml Nml Nml N/A Right Triceps Nml Nml Nml Nml Nml Nml Nml Nml N/A Right Deltoid Nml Nml Nml Nml Nml Nml Nml Nml N/A Left FDI Nml Nml Nml Nml Nml Nml Nml Nml N/A Left EIP Nml Nml Nml Nml Nml Nml Nml Nml N/A Left Pronator teres Nml Nml Nml Nml Nml Nml Nml Nml N/A Left APB Nml Nml Nml *1- *1+ *1+ *1+ Nml N/A Left Biceps Nml Nml Nml Nml Nml Nml Nml Nml N/A Left Triceps Nml Nml Nml Nml Nml Nml Nml Nml N/A Left Deltoid Nml Nml Nml Nml Nml Nml Nml Nml N/A Waveforms: Motor       Sensory              MR Cervical Spine Wo Contrast Result Date: 10/26/2023 CLINICAL DATA:  Initial evaluation for acute myelopathy, trauma. EXAM: MRI CERVICAL SPINE WITHOUT CONTRAST TECHNIQUE: Multiplanar, multisequence MR imaging of the cervical spine was performed. No intravenous contrast was administered. COMPARISON:  Radiograph from 08/13/2023. FINDINGS: Alignment: Straightening with mild reversal of the normal cervical lordosis. No listhesis. Vertebrae: Vertebral body height maintained without acute or chronic fracture. Bone marrow signal intensity overall within normal limits. Benign hemangioma noted within the T2 vertebral body. No worrisome osseous lesions. No abnormal marrow edema. Cord: Normal signal and morphology. No evidence for ligamentous injury. Posterior Fossa, vertebral arteries, paraspinal tissues: Visualized brain and posterior fossa within normal limits. Craniocervical junction normal. Paraspinous soft tissues within normal limits. Normal flow voids seen within the vertebral arteries bilaterally. Disc levels: C2-C3: Normal interspace.  No canal or foraminal stenosis. C3-C4: Minimal disc bulge with uncovertebral spurring. No spinal stenosis. Foramina remain patent. C4-C5: Mild disc bulge with uncovertebral spurring. No spinal stenosis. Foramina remain patent. C5-C6: Diffuse disc bulge with bilateral uncovertebral spurring. Flattening and partial effacement of the ventral thecal sac. Moderate spinal stenosis with associated cord flattening, but no visible cord signal changes. Moderate bilateral C6 foraminal narrowing. C6-C7: Central to left paracentral disc protrusion indents the ventral thecal sac, contacting and flattening the ventral cord (series 106, image 23). Mild to moderate spinal stenosis. Uncovertebral spurring with mild left C7 foraminal narrowing. Right neural foramen remains patent. C7-T1:  Negative interspace.  No canal or foraminal stenosis. IMPRESSION: 1. No MRI evidence for acute traumatic injury  within the cervical spine. 2. Central to left paracentral disc protrusion at C6-7 with resultant mild to moderate spinal stenosis and flattening of the ventral cord. 3. Disc bulge with uncovertebral spurring at C5-6 with resultant moderate spinal stenosis, with moderate bilateral C6 foraminal narrowing. Electronically Signed   By: Morene Hoard M.D.   On: 10/26/2023 18:28         ASSESSMENT & PLAN   Assessment & Plan Cervical myelopathy (HCC) Myalgia Neuropathy Blurry vision Cervical myelopathy with chronic low back pain and sciatica   Chronic cervical myelopathy, likely from past  spinal damage, causes low back pain and sciatica. MRI shows cervical disc thinning and nerve compression, correlating with foot pain and blurry vision. Surgical intervention may relieve pain and prevent future injuries. She continues gabapentin  for nerve pain and baclofen  for muscle spasms. Referred to neurosurgery for surgical evaluation and to physical medicine and rehabilitation for spine management. An MRI of the lumbar spine is ordered to assess additional damage. Referred to ophthalmology for blurry vision evaluation.  Reviewed the MRI with images and independently explained and interpreted the imaging findings to the patient, with interpreter   Blurry vision may relate to cervical myelopathy and increased intracranial pressure from neck bending because she reports they flare together. Referred to ophthalmology to rule out primary eye pathology. Primary hypertension Reviewed available data from patient and  BP Readings from Last 3 Encounters:  01/05/24 130/62  10/03/23 120/66  09/02/23 112/62  My individualized, goal average blood pressure for this patient, after considering the evidence for and against aggressive blood pressure goals as well as their past medical history and preferences, is 140/90 In my medical opinion, this problem is stable, well controlled  Will continue the current medication(s),  unchanged:  Current hypertension medications:       Sig   losartan  (COZAAR ) 100 MG tablet (Taking) Take 1 tablet (100 mg total) by mouth daily.   amLODipine  (NORVASC ) 2.5 MG tablet Take 1 tablet (2.5 mg total) by mouth daily.      Osteoporosis, unspecified osteoporosis type, unspecified pathological fracture presence Osteoporosis may contribute to spinal issues. Alendronate  is prescribed for bone health, and the prescription has been refilled. General Health Maintenance   Discussed estrogen replacement therapy for post-menopausal bone health; Language barrier to communication Due to language barrier, an interpreter was present during the history-taking and subsequent discussion  with this patient.   ORDER ASSOCIATIONS  #   DIAGNOSIS / CONDITION ICD-10 ENCOUNTER ORDER     ICD-10-CM   1. Cervical myelopathy (HCC)  G95.9 Ambulatory referral to Neurosurgery    Ambulatory referral to Physical Medicine Rehab    MR Lumbar Spine Wo Contrast    2. Blurry vision  H53.8 Ambulatory referral to Neurosurgery    3. Myalgia  M79.10 baclofen  (LIORESAL ) 10 MG tablet    amLODipine  (NORVASC ) 2.5 MG tablet    Ambulatory referral to Physical Medicine Rehab    MR Lumbar Spine Wo Contrast    4. Primary hypertension  I10 amLODipine  (NORVASC ) 2.5 MG tablet    5. Neuropathy  G62.9 Ambulatory referral to Physical Medicine Rehab    MR Lumbar Spine Wo Contrast    6. Osteoporosis, unspecified osteoporosis type, unspecified pathological fracture presence  M81.0 alendronate  (FOSAMAX ) 70 MG tablet         Orders Placed in Encounter:  Imaging Orders         MR Lumbar Spine Wo Contrast    Referral Orders         Ambulatory referral to Neurosurgery         Ambulatory referral to Physical Medicine Rehab    Meds ordered this encounter  Medications   baclofen  (LIORESAL ) 10 MG tablet    Sig: Take 1 tablet (10 mg total) by mouth every 12 (twelve) hours as needed for muscle spasms.    Dispense:  180 each     Refill:  3   amLODipine  (NORVASC ) 2.5 MG tablet    Sig: Take 1 tablet (2.5 mg total) by mouth daily.    Dispense:  30 tablet  Refill:  2   alendronate  (FOSAMAX ) 70 MG tablet    Sig: Take 1 tablet (70 mg total) by mouth once a week.    Dispense:  13 tablet    Refill:  3     This document was synthesized by artificial intelligence ([employer]) using HIPAA-compliant recording of the clinical interaction;   We discussed the use of AI scribe software for clinical note transcription with the patient, who gave verbal consent to proceed. additional Info: This encounter employed state-of-the-art, real-time, collaborative documentation. The patient actively reviewed and assisted in updating their electronic medical record on a shared screen, ensuring transparency and facilitating joint problem-solving for the problem list, overview, and plan. This approach promotes accurate, informed care. The treatment plan was discussed and reviewed in detail, including medication safety, potential side effects, and all patient questions. We confirmed understanding and comfort with the plan. Follow-up instructions were established, including contacting the office for any concerns, returning if symptoms worsen, persist, or new symptoms develop, and precautions for potential emergency department visits.

## 2024-01-20 ENCOUNTER — Encounter: Admitting: Internal Medicine

## 2024-01-25 ENCOUNTER — Ambulatory Visit
Admission: RE | Admit: 2024-01-25 | Discharge: 2024-01-25 | Disposition: A | Discharge status: Home / Self Care | Source: Ambulatory Visit | Attending: Internal Medicine | Admitting: Internal Medicine

## 2024-01-25 DIAGNOSIS — M791 Myalgia, unspecified site: Secondary | ICD-10-CM

## 2024-01-25 DIAGNOSIS — G959 Disease of spinal cord, unspecified: Secondary | ICD-10-CM

## 2024-01-25 DIAGNOSIS — G629 Polyneuropathy, unspecified: Secondary | ICD-10-CM

## 2024-01-28 ENCOUNTER — Other Ambulatory Visit: Payer: Self-pay | Admitting: Internal Medicine

## 2024-01-28 ENCOUNTER — Ambulatory Visit: Payer: Self-pay | Admitting: Internal Medicine

## 2024-01-28 DIAGNOSIS — M791 Myalgia, unspecified site: Secondary | ICD-10-CM

## 2024-02-13 NOTE — Progress Notes (Signed)
 "  Referring Physician:  Jesus Bernardino MATSU, MD [address] [place],  [place] [address]  Primary Physician:  Jesus Bernardino MATSU, MD  Interpreter used as patient does not speak english.   History of Present Illness: 02/19/2024 Dawn [name] has a history of CHF, HTNk atherosclerosis of aorta, GERD, neuropathy, osteoporosis, hyperlipidemia, DM.   Was also referred to PMR- no appointment scheduled.   She has constant posterior neck pain and constant diffuse back pain. She has intermittent pain in bilateral arms to hands and bilateral legs to feet (entire arm and entire leg). Leg pain is more constant than her arm pain. She feels like a lot of her pain radiates up from her feet. No numbness, tingling, or weakness in her arms/legs. Leg pain is worse with standing and back pain is worse with sitting. Some relief with massage and exercise.   No dexterity issues. No numbness, tingling, or weakness in her hands. No balance issues.   She is on neurontin  and baclofen . Did PT last year with no improvement.   Tobacco use: she smokes 1 ppd x 50+ years.   Bowel/Bladder Dysfunction: none  Conservative measures:  Physical therapy:  has participated with [facility] (records in media)-10/22/22-03/14/23, PT scheduled for 02/16/23 at [facility] and she no showed.  Multimodal medical therapy including regular antiinflammatories:  Gabapentin , Cymbalta , Baclofen , Lidocaine  cream, Prednisone  Injections:  no epidural steroid injections  Past Surgery: no spine surgeries  Adryan Rosner has no symptoms of cervical myelopathy.  The symptoms are causing a significant impact on the patient's life.   Review of Systems:  A 10 point review of systems is negative, except for the pertinent positives and negatives detailed in the HPI.  Past Medical History: Past Medical History:  Diagnosis Date   Acute hyponatremia 03/06/2020   Arthritis    Diabetes mellitus without complication (HCC)    Elevated BP without diagnosis of  hypertension    Hypertension     Past Surgical History: Past Surgical History:  Procedure Laterality Date   CESAREAN SECTION      Allergies: Allergies as of 02/19/2024 - Review Complete 01/05/2024  Allergen Reaction Noted   Thiazide-type diuretics Other (See Comments) 03/12/2020    Medications: Outpatient Encounter Medications as of 02/19/2024  Medication Sig   alendronate  (FOSAMAX ) 70 MG tablet Take 1 tablet (70 mg total) by mouth once a week.   amLODipine  (NORVASC ) 2.5 MG tablet Take 1 tablet (2.5 mg total) by mouth daily.   atorvastatin  (LIPITOR) 40 MG tablet Take 1 tablet (40 mg total) by mouth daily.   baclofen  (LIORESAL ) 10 MG tablet TAKE 1 TABLET(10 MG) BY MOUTH EVERY 12 HOURS AS NEEDED FOR MUSCLE SPASMS   Cyanocobalamin  (B-12) 1000 MCG SUBL Place 1 tablet under the tongue daily at 6 (six) AM.   DULoxetine  (CYMBALTA ) 60 MG capsule Take 1 capsule (60 mg total) by mouth daily.   ergocalciferol  (VITAMIN D2) 1.25 MG (50000 UT) capsule Take 1 capsule (50,000 Units total) by mouth once a week.   gabapentin  (NEURONTIN ) 600 MG tablet TAKE 1 TABLET(600 MG) BY MOUTH THREE TIMES DAILY   lidocaine -prilocaine  (EMLA ) cream Apply topically 2 (two) times daily. (Patient not taking: Reported on 01/05/2024)   lidocaine -prilocaine  (EMLA ) cream Apply 1 Application topically as needed.   losartan  (COZAAR ) 100 MG tablet Take 1 tablet (100 mg total) by mouth daily.   metFORMIN  (GLUCOPHAGE ) 500 MG tablet Take 1 tablet (500 mg total) by mouth daily.   pantoprazole  (PROTONIX ) 40 MG tablet Take 1  tablet (40 mg total) by mouth daily.   predniSONE  (DELTASONE ) 20 MG tablet TAKE 2 TABLETS BY MOUTH DAILY FOR 3 DAYS THEN TAKE 1 TABLET BY MOUTH DAILY FOR 4 DAYS (Patient not taking: Reported on 01/05/2024)   Facility-Administered Encounter Medications as of 02/19/2024  Medication   cyanocobalamin  (VITAMIN B12) injection 1,000 mcg    Social History: Social History[1]  Family Medical History: Family History   Problem Relation Age of Onset   Chorea Father    Colon cancer Neg Hx    Esophageal cancer Neg Hx    Rectal cancer Neg Hx    Stomach cancer Neg Hx     Physical Examination: Vitals:   02/19/24 0837  BP: 138/70    General: Patient is well developed, well nourished, calm, collected, and in no apparent distress. Attention to examination is appropriate.  Respiratory: Patient is breathing without any difficulty.   NEUROLOGICAL:     Awake, alert, oriented to person, place, and time.  Speech is clear and fluent. Fund of knowledge is appropriate.   Cranial Nerves: Pupils equal round and reactive to light.  Facial tone is symmetric.    Mild lower posterior cervical tenderness. diffuse tenderness in bilateral trapezial region.   Diffuse thoracic tenderness into lower lumbar spine.   No abnormal lesions on exposed skin.   Strength: Side Biceps Triceps Deltoid Interossei Grip Wrist Ext. Wrist Flex.  R 5 5 5 5 5 5 5   L 5 5 5 5 5 5 5    Side Iliopsoas Quads Hamstring PF DF EHL  R 5 5 5 5 5 5   L 5 5 5 5 5 5    Reflexes are 2+ and symmetric at the biceps, brachioradialis, patella and achilles.   Hoffman's is absent.  Clonus is not present.   Bilateral upper and lower extremity sensation is intact to light touch.     No pain with good ROM of both shoulders.  No pain with IR/ER of both hips.   Gait is normal.     Medical Decision Making  Imaging: Cervical MRI dated 10/26/23:  FINDINGS: Alignment: Straightening with mild reversal of the normal cervical lordosis. No listhesis.   Vertebrae: Vertebral body height maintained without acute or chronic fracture. Bone marrow signal intensity overall within normal limits. Benign hemangioma noted within the T2 vertebral body. No worrisome osseous lesions. No abnormal marrow edema.   Cord: Normal signal and morphology. No evidence for ligamentous injury.   Posterior Fossa, vertebral arteries, paraspinal tissues: Visualized brain  and posterior fossa within normal limits. Craniocervical junction normal. Paraspinous soft tissues within normal limits. Normal flow voids seen within the vertebral arteries bilaterally.   Disc levels:   C2-C3: Normal interspace.  No canal or foraminal stenosis.   C3-C4: Minimal disc bulge with uncovertebral spurring. No spinal stenosis. Foramina remain patent.   C4-C5: Mild disc bulge with uncovertebral spurring. No spinal stenosis. Foramina remain patent.   C5-C6: Diffuse disc bulge with bilateral uncovertebral spurring. Flattening and partial effacement of the ventral thecal sac. Moderate spinal stenosis with associated cord flattening, but no visible cord signal changes. Moderate bilateral C6 foraminal narrowing.   C6-C7: Central to left paracentral disc protrusion indents the ventral thecal sac, contacting and flattening the ventral cord (series 106, image 23). Mild to moderate spinal stenosis. Uncovertebral spurring with mild left C7 foraminal narrowing. Right neural foramen remains patent.   C7-T1:  Negative interspace.  No canal or foraminal stenosis.   IMPRESSION: 1. No MRI evidence for acute traumatic  injury within the cervical spine. 2. Central to left paracentral disc protrusion at C6-7 with resultant mild to moderate spinal stenosis and flattening of the ventral cord. 3. Disc bulge with uncovertebral spurring at C5-6 with resultant moderate spinal stenosis, with moderate bilateral C6 foraminal narrowing.     Electronically Signed   By: Morene Hoard M.D.   On: 10/26/2023 18:28    Cervical xrays dated 08/13/23:  FINDINGS: There is again straightening of the normal cervical lordosis. No sagittal spondylolisthesis. The atlantodens interval is intact. Vertebral body heights are maintained.   Minimal anterior C5-6 and C6-7 disc space narrowing. Mild anterior C4-5, C5-6, and C6-7 endplate osteophytes are mildly worsened from remote 2007 comparison.    Very mild right C5-6 neuroforaminal narrowing secondary to uncovertebral and facet joint hypertrophy on oblique view, unchanged to mildly worsened from prior.   The lateral masses of C1 are symmetrically aligned with the dens on open mouth odontoid view. No prevertebral soft tissue swelling.   IMPRESSION: 1. Mild C4-5, C5-6, and C6-7 degenerative disc and endplate changes, mildly worsened from remote 2007 comparison. 2. Very mild right C5-6 neuroforaminal narrowing secondary to uncovertebral and facet joint hypertrophy, unchanged to mildly worsened from prior.     Electronically Signed   By: Tanda Lyons M.D.   On: 08/13/2023 16:34       Lumbar MRI dated 01/25/24:  FINDINGS: Segmentation:  Standard.   Alignment:  Physiologic.   Vertebrae: No acute fracture, evidence of discitis, or aggressive bone lesion.   Conus medullaris and cauda equina: Conus extends to the L1-2 level. Conus and cauda equina appear normal.   Paraspinal and other soft tissues: No acute paraspinal abnormality.   Disc levels:   Disc spaces: Disc spaces are maintained.   T12-L1: No significant disc bulge. No neural foraminal stenosis. No central canal stenosis.   L1-L2: No significant disc bulge. No neural foraminal stenosis. No central canal stenosis.   L2-L3: No significant disc bulge. No neural foraminal stenosis. No central canal stenosis.   L3-L4: No significant disc bulge. No neural foraminal stenosis. No central canal stenosis.   L4-L5: Mild disc bulge with a small central annular fissure. Mild bilateral facet arthropathy. No foraminal or central canal stenosis peer   L5-S1: Mild disc bulge. Mild bilateral facet arthropathy, right worse than left. No foraminal or central canal stenosis.   IMPRESSION: 1. At L4-5 there is a mild disc bulge with a small central annular fissure. Mild bilateral facet arthropathy. 2. At L5-S1 there is a mild disc bulge. Mild bilateral  facet arthropathy, right worse than left. 3. No acute osseous injury of the lumbar spine.     Electronically Signed   By: Julaine Blanch M.D.   On: 01/28/2024 15:26    Lumbar xrays dated 08/13/23:  FINDINGS: There are 5 non-rib-bearing lumbar-type vertebral bodies. Vertebral body heights are maintained. No sagittal spondylolisthesis. Moderate left L2-3 and right L3-4 endplate osteophytes on frontal view. Mild anterior L3-4 greater than L2-3 endplate osteophytes on lateral view.   Disc spaces are preserved.   Mild L5-S1 facet joint sclerosis and hypertrophy.   IMPRESSION: 1. Mild L5-S1 facet joint osteoarthritis. 2. Mild L2-3 and L3-4 degenerative endplate osteophytes.     Electronically Signed   By: Tanda Lyons M.D.   On: 08/13/2023 16:29   Thoracic xrays dated 08/13/23:  FINDINGS: There are 12 rib-bearing thoracic type vertebral bodies. Normal frontal alignment. No sagittal spondylolisthesis. Vertebral body heights are maintained. Mild multilevel anterior disc space narrowing  and anterior endplate spurring throughout the thoracic spine, similar to prior.   Mild descending thoracic aorta atherosclerotic calcifications. Moderate atherosclerotic calcifications within the aortic arch.   IMPRESSION: Mild multilevel degenerative disc and endplate changes throughout the thoracic spine, similar to prior.     Electronically Signed   By: Tanda Lyons M.D.   On: 08/13/2023 16:31    I have personally reviewed the images and agree with the above interpretation.   EMG of bilateral upper extremities dated 10/28/23:  Impression: This is an abnormal study. 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 No electrodiagnostic evidence of left or right cervical (C5-T1) motor radiculopathy. Screening studies for left or right ulnar or radial  mononeuropathies are normal.       ___________________________ Venetia Potters, MD   Assessment and Plan: Ms. Livecchi has constant posterior neck pain and constant diffuse back pain. She has intermittent pain in bilateral arms to hands and bilateral legs to feet (entire arm and entire leg). No numbness, tingling, or weakness in her arms/legs.   No dexterity issues. No numbness, tingling, or weakness in her hands. No balance issues.   She has known moderate central and bilateral foraminal stenosis C5-C6 and mild central stenosis C6-C7 with mild left foraminal stenosis.   EMG showed bilateral severe carpal tunnel syndrome.   Also with known thoracic spondylosis. She has lumbar spondylosis as well with no central or foraminal stenosis.    Neck and arm pain likely cervical mediated. Does not appear to have any symptoms of carpal tunnel syndrome. No signs/symptoms of cervical myelopathy.   Thoracic pain likely due to thoracic spondylosis.   LBP likely due to lumbar spondylosis. Do not see good cause of leg pain.   Treatment options discussed with patient and following plan made:   - EMG of lower extremities to further evaluate bilateral leg pain. Referral to [facility]. Dr. Potters did her UE EMG.  - Referral to PMR at [facility] to discuss possible cervical and lumbar injections.  - Will follow for carpal tunnel for now, does not appear to have any symptoms from this.  - Once I get her EMG results, we can regroup regarding follow up. May do phone visit with her daughters and then have her see me after she sees PMR.   I spent a total of 45 minutes in face-to-face and non-face-to-face activities related to this patient's care today including review of outside records, review of imaging, review of symptoms, physical exam, discussion of differential diagnosis, discussion of treatment options, and documentation.   Thank you for involving me in the care of this patient.   Glade Boys PA-C Dept. of  Neurosurgery      [1]  Social History Tobacco Use   Smoking status: Every Day    Current packs/day: 1.00    Average packs/day: 1 pack/day for 53.0 years (53.0 ttl pk-yrs)    Types: Cigarettes    Start date: 1973   Smokeless tobacco: Never   Tobacco comments:    1 pack a day 08/28/23  Vaping Use   Vaping status: Never Used  Substance Use Topics   Alcohol use: Never   Drug use: Never   "

## 2024-02-16 ENCOUNTER — Other Ambulatory Visit: Payer: Self-pay

## 2024-02-16 ENCOUNTER — Ambulatory Visit: Admitting: Physical Therapy

## 2024-02-16 DIAGNOSIS — M791 Myalgia, unspecified site: Secondary | ICD-10-CM

## 2024-02-16 DIAGNOSIS — G959 Disease of spinal cord, unspecified: Secondary | ICD-10-CM

## 2024-02-16 DIAGNOSIS — G629 Polyneuropathy, unspecified: Secondary | ICD-10-CM

## 2024-02-16 NOTE — Therapy (Deleted)
 " OUTPATIENT PHYSICAL THERAPY CERVICAL EVALUATION   Patient Name: Dawn [name] MRN: [id number] DOB:09-12-1955, 69 y.o., female Today's Date: 02/16/2024  END OF SESSION:   Past Medical History:  Diagnosis Date   Acute hyponatremia 03/06/2020   Arthritis    Diabetes mellitus without complication (HCC)    Elevated BP without diagnosis of hypertension    Hypertension    Past Surgical History:  Procedure Laterality Date   CESAREAN SECTION     Patient Active Problem List   Diagnosis Date Noted   Foot pain, bilateral 10/04/2023   Chronic left-sided low back pain with left-sided sciatica 10/04/2023   Cervical myelopathy (HCC) 10/04/2023   Atherosclerosis of aorta 08/16/2023   Multilevel degenerative disc disease 08/16/2023   Osteoarthritis 08/16/2023   Positive ANA (antinuclear antibody) 08/16/2023   Elevated sed rate 08/16/2023   Prediabetes 08/16/2023   Gastroesophageal reflux disease without esophagitis 07/22/2023   Vitamin D  deficiency 07/22/2023   Osteoporosis 07/22/2023   Hyperlipidemia 07/22/2023   History of anemia 07/22/2023   Language barrier to communication 07/22/2023   Onychomycosis 07/22/2023   External hemorrhoids 01/16/2021   Family history of sickle cell trait 07/06/2020   Neuropathy 03/15/2020   Numbness 03/06/2020   Emesis 03/06/2020   Epigastric pain 07/09/2019   Arthralgia 06/04/2019   Hypertension 06/04/2019   Healthcare maintenance 06/04/2019   Hypokalemia 06/04/2019   CHF (congestive heart failure) (HCC) 07/22/2016   Chest pain 07/22/2016    PCP: ***  REFERRING PROVIDER: ***  REFERRING DIAG: ***  THERAPY DIAG:  No diagnosis found.  Rationale for Evaluation and Treatment: {HABREHAB:27488}  ONSET DATE: ***  SUBJECTIVE:                                                                                                                                                                                                         SUBJECTIVE  STATEMENT: *** Hand dominance: {MISC; OT HAND [phone]}  PERTINENT HISTORY:  Cervical myelopathy with chronic low back pain and sciatica   Chronic cervical myelopathy, likely from past spinal damage, causes low back pain and sciatica. MRI shows cervical disc thinning and nerve compression, correlating with foot pain and blurry vision. Surgical intervention may relieve pain and prevent future injuries. She continues gabapentin  for nerve pain and baclofen  for muscle spasms. Referred to neurosurgery for surgical evaluation and to physical medicine and rehabilitation for spine management. An MRI of the lumbar spine is ordered to assess additional damage. Referred to ophthalmology for blurry vision evaluation.   Reviewed the MRI with images and independently explained and interpreted the  imaging findings to the patient, with interpreter    Blurry vision may relate to cervical myelopathy and increased intracranial pressure from neck bending because she reports they flare together. Referred to ophthalmology to rule out primary eye pathology.  PAIN:  Are you having pain? {OPRCPAIN:27236}  PRECAUTIONS: {Therapy precautions:24002}  RED FLAGS: {PT Red Flags:29287}     WEIGHT BEARING RESTRICTIONS: {Yes ***/No:24003}  FALLS:  Has patient fallen in last 6 months? {fallsyesno:27318}  LIVING ENVIRONMENT: Lives with: {OPRC lives with:25569::lives with their family} Lives in: {Lives in:25570} Stairs: {opstairs:27293} Has following equipment at home: {Assistive devices:23999}  OCCUPATION: ***  PLOF: {PLOF:24004}  PATIENT GOALS: ***  NEXT MD VISIT: ***  OBJECTIVE:  Note: Objective measures were completed at Evaluation unless otherwise noted.  DIAGNOSTIC FINDINGS:  MRI LUMBAR SPINE WITHOUT CONTRAST   TECHNIQUE: Multiplanar, multisequence MR imaging of the lumbar spine was performed. No intravenous contrast was administered.   COMPARISON:  None Available.    FINDINGS: Segmentation:  Standard.   Alignment:  Physiologic.   Vertebrae: No acute fracture, evidence of discitis, or aggressive bone lesion.   Conus medullaris and cauda equina: Conus extends to the L1-2 level. Conus and cauda equina appear normal.   Paraspinal and other soft tissues: No acute paraspinal abnormality.   Disc levels:   Disc spaces: Disc spaces are maintained.   T12-L1: No significant disc bulge. No neural foraminal stenosis. No central canal stenosis.   L1-L2: No significant disc bulge. No neural foraminal stenosis. No central canal stenosis.   L2-L3: No significant disc bulge. No neural foraminal stenosis. No central canal stenosis.   L3-L4: No significant disc bulge. No neural foraminal stenosis. No central canal stenosis.   L4-L5: Mild disc bulge with a small central annular fissure. Mild bilateral facet arthropathy. No foraminal or central canal stenosis peer   L5-S1: Mild disc bulge. Mild bilateral facet arthropathy, right worse than left. No foraminal or central canal stenosis.   IMPRESSION: 1. At L4-5 there is a mild disc bulge with a small central annular fissure. Mild bilateral facet arthropathy. 2. At L5-S1 there is a mild disc bulge. Mild bilateral facet arthropathy, right worse than left. 3. No acute osseous injury of the lumbar spine.    Cervical myelopathy with chronic low back pain and sciatica   Chronic cervical myelopathy, likely from past spinal damage, causes low back pain and sciatica. MRI shows cervical disc thinning and nerve compression, correlating with foot pain and blurry vision. Surgical intervention may relieve pain and prevent future injuries. She continues gabapentin  for nerve pain and baclofen  for muscle spasms. Referred to neurosurgery for surgical evaluation and to physical medicine and rehabilitation for spine management. An MRI of the lumbar spine is ordered to assess additional damage. Referred to ophthalmology for  blurry vision evaluation.   Reviewed the MRI with images and independently explained and interpreted the imaging findings to the patient, with interpreter    Blurry vision may relate to cervical myelopathy and increased intracranial pressure from neck bending because she reports they flare together. Referred to ophthalmology to rule out primary eye pathology.  PATIENT SURVEYS:  {rehab surveys:24030}  COGNITION: Overall cognitive status: {cognition:24006}  SENSATION: {sensation:27233}  POSTURE: {posture:25561}  PALPATION: ***   CERVICAL ROM:   {AROM/PROM:27142} ROM A/PROM (deg) eval  Flexion   Extension   Right lateral flexion   Left lateral flexion   Right rotation   Left rotation    (Blank rows = not tested)  UPPER EXTREMITY ROM:  {AROM/PROM:27142} ROM  Right eval Left eval  Shoulder flexion    Shoulder extension    Shoulder abduction    Shoulder adduction    Shoulder extension    Shoulder internal rotation    Shoulder external rotation    Elbow flexion    Elbow extension    Wrist flexion    Wrist extension    Wrist ulnar deviation    Wrist radial deviation    Wrist pronation    Wrist supination     (Blank rows = not tested)  UPPER EXTREMITY MMT:  MMT Right eval Left eval  Shoulder flexion    Shoulder extension    Shoulder abduction    Shoulder adduction    Shoulder extension    Shoulder internal rotation    Shoulder external rotation    Middle trapezius    Lower trapezius    Elbow flexion    Elbow extension    Wrist flexion    Wrist extension    Wrist ulnar deviation    Wrist radial deviation    Wrist pronation    Wrist supination    Grip strength     (Blank rows = not tested)  CERVICAL SPECIAL TESTS:  {Cervical special tests:25246}  FUNCTIONAL TESTS:  {Functional tests:24029}  TREATMENT DATE: ***                                                                                                                                  PATIENT EDUCATION:  Education details: *** Person educated: {Person educated:25204} Education method: {Education Method:25205} Education comprehension: {Education Comprehension:25206}  HOME EXERCISE PROGRAM: ***  ASSESSMENT:  CLINICAL IMPRESSION: Patient is a *** y.o. *** who was seen today for physical therapy evaluation and treatment for ***.   OBJECTIVE IMPAIRMENTS: {opptimpairments:25111}.   ACTIVITY LIMITATIONS: {activitylimitations:27494}  PARTICIPATION LIMITATIONS: {participationrestrictions:25113}  PERSONAL FACTORS: {Personal factors:25162} are also affecting patient's functional outcome.   REHAB POTENTIAL: {rehabpotential:25112}  CLINICAL DECISION MAKING: {clinical decision making:25114}  EVALUATION COMPLEXITY: {Evaluation complexity:25115}   GOALS: Goals reviewed with patient? {yes/no:20286}  SHORT TERM GOALS: Target date: ***  *** Baseline:  Goal status: INITIAL  2.  *** Baseline:  Goal status: INITIAL  3.  *** Baseline:  Goal status: INITIAL  4.  *** Baseline:  Goal status: INITIAL  5.  *** Baseline:  Goal status: INITIAL  6.  *** Baseline:  Goal status: INITIAL  LONG TERM GOALS: Target date: ***  *** Baseline:  Goal status: INITIAL  2.  *** Baseline:  Goal status: INITIAL  3.  *** Baseline:  Goal status: INITIAL  4.  *** Baseline:  Goal status: INITIAL  5.  *** Baseline:  Goal status: INITIAL  6.  *** Baseline:  Goal status: INITIAL   PLAN:  PT FREQUENCY: {rehab frequency:25116}  PT DURATION: {rehab duration:25117}  PLANNED INTERVENTIONS: {rehab planned interventions:25118::97110-Therapeutic exercises,97530- Therapeutic [phone]- Neuromuscular re-education,97535- Self Rjmz,02859- Manual therapy,Patient/Family education}  PLAN FOR NEXT SESSION: ***   Tiasia Weberg,  PT 02/16/2024, 8:04 AM      "

## 2024-02-19 ENCOUNTER — Encounter: Payer: Self-pay | Admitting: Orthopedic Surgery

## 2024-02-19 ENCOUNTER — Ambulatory Visit: Admitting: Orthopedic Surgery

## 2024-02-19 VITALS — BP 138/70 | Ht 61.25 in | Wt 145.5 lb

## 2024-02-19 DIAGNOSIS — M47814 Spondylosis without myelopathy or radiculopathy, thoracic region: Secondary | ICD-10-CM | POA: Diagnosis not present

## 2024-02-19 DIAGNOSIS — M47816 Spondylosis without myelopathy or radiculopathy, lumbar region: Secondary | ICD-10-CM | POA: Diagnosis not present

## 2024-02-19 DIAGNOSIS — G5603 Carpal tunnel syndrome, bilateral upper limbs: Secondary | ICD-10-CM

## 2024-02-19 DIAGNOSIS — M4802 Spinal stenosis, cervical region: Secondary | ICD-10-CM | POA: Diagnosis not present

## 2024-02-19 DIAGNOSIS — M5412 Radiculopathy, cervical region: Secondary | ICD-10-CM

## 2024-02-19 DIAGNOSIS — M47812 Spondylosis without myelopathy or radiculopathy, cervical region: Secondary | ICD-10-CM

## 2024-02-19 DIAGNOSIS — M79604 Pain in right leg: Secondary | ICD-10-CM

## 2024-02-19 NOTE — Patient Instructions (Signed)
 It was so nice to see you today. Thank you so much for coming in.    You have some wear and tear in your neck and back. You also have carpal tunnel in both hands. Neck and arm pain may be from your neck. Your back pain is likely from your back. I am not sure what is causing your leg pain.   I want you to see physical medicine and rehab at the [facility] to discuss possible injections in your neck and back. Dr. Avanell, Dr. Dodson, and their NP [place] are great and will take good care of you. They should call you to schedule an appointment or you can call them at [phone].   I want to get an EMG (nerve conduction test) to look into things further. I have ordered this and [facility] will call you to schedule. You can also call them at [phone].   We will regroup once I get your EMG results. Will likely do phone visit, but can bring you back in if needed.   Please do not hesitate to call if you have any questions or concerns. You can also message me in MyChart.   Glade Boys PA-C [phone]     The physicians and staff at [facility] are committed to providing excellent care. You may receive a survey asking for feedback about your experience at our office. We value you your feedback and appreciate you taking the time to to fill it out. [facility] leadership team is also available to discuss your experience in person, feel free to contact us  [phone].

## 2024-02-25 ENCOUNTER — Other Ambulatory Visit: Payer: Self-pay | Admitting: Internal Medicine

## 2024-02-25 DIAGNOSIS — I10 Essential (primary) hypertension: Secondary | ICD-10-CM

## 2024-03-03 ENCOUNTER — Other Ambulatory Visit: Payer: Self-pay

## 2024-03-03 DIAGNOSIS — R202 Paresthesia of skin: Secondary | ICD-10-CM

## 2024-03-12 ENCOUNTER — Telehealth: Payer: Self-pay | Admitting: Internal Medicine

## 2024-03-12 NOTE — Telephone Encounter (Signed)
 With interpreter- LVM to r/s physical therapy appt on 03/15/24

## 2024-03-15 ENCOUNTER — Ambulatory Visit: Admitting: Internal Medicine

## 2024-03-15 ENCOUNTER — Ambulatory Visit: Admitting: Physical Therapy

## 2024-03-26 ENCOUNTER — Ambulatory Visit: Admitting: Internal Medicine

## 2024-04-27 ENCOUNTER — Encounter: Payer: Self-pay | Admitting: Neurology

## 2024-05-06 ENCOUNTER — Ambulatory Visit: Admitting: Internal Medicine
